# Patient Record
Sex: Female | Born: 1970 | Race: White | Hispanic: No | Smoking: Current every day smoker
Health system: Southern US, Community
[De-identification: ages and names within clinical notes are randomized; demographics above are authoritative.]

## PROBLEM LIST (undated history)

## (undated) DIAGNOSIS — F329 Major depressive disorder, single episode, unspecified: Secondary | ICD-10-CM

## (undated) DIAGNOSIS — F419 Anxiety disorder, unspecified: Secondary | ICD-10-CM

## (undated) DIAGNOSIS — F32A Depression, unspecified: Secondary | ICD-10-CM

## (undated) DIAGNOSIS — I82409 Acute embolism and thrombosis of unspecified deep veins of unspecified lower extremity: Secondary | ICD-10-CM

## (undated) DIAGNOSIS — I10 Essential (primary) hypertension: Secondary | ICD-10-CM

## (undated) HISTORY — PX: ABDOMINAL HYSTERECTOMY: SHX81

## (undated) HISTORY — PX: BLADDER SUSPENSION: SHX72

## (undated) HISTORY — PX: ENDOVEIN HARVEST OF GREATER SAPHENOUS VEIN: SHX5059

---

## 1997-09-03 ENCOUNTER — Ambulatory Visit (HOSPITAL_COMMUNITY): Admission: RE | Admit: 1997-09-03 | Discharge: 1997-09-03 | Payer: Self-pay | Admitting: Family Medicine

## 1998-05-29 ENCOUNTER — Ambulatory Visit (HOSPITAL_BASED_OUTPATIENT_CLINIC_OR_DEPARTMENT_OTHER): Admission: RE | Admit: 1998-05-29 | Discharge: 1998-05-29 | Payer: Self-pay

## 1999-07-30 ENCOUNTER — Ambulatory Visit (HOSPITAL_COMMUNITY): Admission: RE | Admit: 1999-07-30 | Discharge: 1999-07-30 | Payer: Self-pay

## 2002-08-28 ENCOUNTER — Other Ambulatory Visit: Admission: RE | Admit: 2002-08-28 | Discharge: 2002-08-28 | Payer: Self-pay | Admitting: Obstetrics and Gynecology

## 2007-05-17 ENCOUNTER — Other Ambulatory Visit: Admission: RE | Admit: 2007-05-17 | Discharge: 2007-05-17 | Payer: Self-pay | Admitting: Family Medicine

## 2007-05-25 ENCOUNTER — Encounter: Admission: RE | Admit: 2007-05-25 | Discharge: 2007-05-25 | Payer: Self-pay | Admitting: Family Medicine

## 2007-08-21 ENCOUNTER — Encounter (INDEPENDENT_AMBULATORY_CARE_PROVIDER_SITE_OTHER): Payer: Self-pay | Admitting: Obstetrics and Gynecology

## 2007-08-22 ENCOUNTER — Inpatient Hospital Stay (HOSPITAL_COMMUNITY): Admission: RE | Admit: 2007-08-22 | Discharge: 2007-08-23 | Payer: Self-pay | Admitting: Obstetrics and Gynecology

## 2008-10-28 ENCOUNTER — Encounter: Admission: RE | Admit: 2008-10-28 | Discharge: 2008-10-28 | Payer: Self-pay | Admitting: Nurse Practitioner

## 2009-04-07 ENCOUNTER — Emergency Department (HOSPITAL_BASED_OUTPATIENT_CLINIC_OR_DEPARTMENT_OTHER): Admission: EM | Admit: 2009-04-07 | Discharge: 2009-04-07 | Payer: Self-pay | Admitting: Emergency Medicine

## 2009-04-07 ENCOUNTER — Ambulatory Visit: Payer: Self-pay | Admitting: Diagnostic Radiology

## 2009-10-21 ENCOUNTER — Encounter: Admission: RE | Admit: 2009-10-21 | Discharge: 2009-10-21 | Payer: Self-pay | Admitting: Family Medicine

## 2010-06-08 NOTE — Op Note (Signed)
[name], [name]               ACCOUNT NO.:  [id number]   MEDICAL RECORD NO.:  [id number]          PATIENT TYPE:  AMB   LOCATION:  SDC                           FACILITY:  [facility]   PHYSICIAN:  Guy Sandifer. Henderson Cloud, M.D. DATE OF BIRTH:  Jan 08, 1971   DATE OF PROCEDURE:  08/21/2007  DATE OF DISCHARGE:                               OPERATIVE REPORT   PREOPERATIVE DIAGNOSES:  Menorrhagia, pelvic relaxation, and stress  urinary incontinence.   POSTOPERATIVE DIAGNOSES:  Menorrhagia, pelvic relaxation, and stress  urinary incontinence.   PROCEDURE:  Laparoscopically-assisted vaginal hysterectomy, anterior  vaginal repair, insertion of mesh, vaginal colpopexy, and posterior  vaginal repair.   SURGEON:  Guy Sandifer. Henderson Cloud, MD   ASSISTANT:  Freddy Finner, MD   ANESTHESIA:  General endotracheal intubation.   SPECIMEN:  Uterus to pathology.   ESTIMATED BLOOD LOSS:  300 mL.   COMPLICATIONS:  Rectal perforation.   INDICATIONS AND CONSENT:  This patient is a 40 year old married white  female G2 P2, status post tubal ligation who has heavy menses.  She also  has symptomatic pelvic relaxation and leaking of urine.  Details were  dictated in history and physical.  Laparoscopically-assisted vaginal  hysterectomy, removal of ovaries if abnormal, anterior vaginal repair,  posterior vaginal repair, insertion of mesh, and mid urethral sling have  been discussed preoperatively.  Potential risks and complications have  been discussed preoperatively including but not limited to infection,  organ damage, bleeding requiring transfusion of blood products with  possible transfusion reaction, HIV and hepatitis acquisition, DVT, PE,  pneumonia, fistula formation, postoperative dyspareunia, pelvic pain,  and laparotomy.  Issues of delayed healing, erosion, urinary retention,  self-catheterization, prolonged catheterization returned to the  operating room, dyspareunia, and recurrent pelvic relaxation, and  irritative voiding symptoms have been discussed preoperatively.  All  questions had been answered and consent was signed on the chart.   FINDINGS:  There are multiple filmy adhesions on the dome of the right  lobe of the liver to the diaphragm.  The left ovary contains a 2-cm  smooth translucent cyst.  Right ovary is normal.  Anterior posterior cul-  de-sacs were normal.  Uterus was retroverted about 6 to 8 weeks in size.   PROCEDURE:  The patient was taken to the operating room where she was  identified, placed in the dorsosupine position and general anesthesia  was induced via endotracheal intubation.  She was then placed in dorsal  lithotomy position where she was prepped abdominally and vaginally.  Bladder straight catheterized.  Hulka tenaculum was placed and the  uterus was manipulated.  She was draped in the sterile fashion.  The  infraumbilical and suprapubic areas are injected at the midline with  0.5% plain Marcaine.  A small infraumbilical incision is made and a  disposable Veress needle was placed on the first attempt without  difficulty.  A normal syringe and drop test were noted.  Two liters of  gas was insufflated under low pressure with good tympany in the right  upper quadrant.  Veress needle was removed and a10-11 Xcel bladeless  disposable trocar  sleeve was placed using direct visualization with the  diagnostic laparoscopic.  After placement, the operative laparoscope was  placed.  A small suprapubic incision was made and a 5-mm bladeless Xcel  disposable trocar sleeve was placed under direct visualization without  difficulty.  The above findings were noted.  The left ovarian cyst is  aspirated for straw-colored serous fluid.  However, the aspirator was  obstructed not allowing me to aspirate the fluid to be sent for  cytology.  Good hemostasis was noted.  Then, using the EnSeal bipolar  cautery cutting instrument, the proximal ligaments were taken down  bilaterally to  the vesicouterine peritoneum.  Good hemostasis was  maintained.  The vesicouterine peritoneum was then taken down cephalad  laterally sharply.  Suprapubic trocar sleeve was removed.  Instruments  were removed.  Attention was turned to the vagina.  Posterior cul-de-  sacs entered sharply and the cervix was circumscribed with unipolar  cautery.  Mucosa was advanced sharply and bluntly.  Then using the gyrus  bipolar cautery instrument, the uterosacral ligaments were taken down  bilaterally followed by the bladder pillars, cardinal ligaments, and the  uterine vessels.  The fundus was delivered posteriorly and the specimen  was delivered.  The uterosacral ligaments were plicated vaginal cuff  bilaterally with 0 Monocryl suture.  All suture will be 0 Monocryl  unless otherwise designated.  Uterosacral ligaments were then plicated  in the midline with a third suture and the cuff is closed with figure-of-  eights.  The anterior vaginal mucosa was then injected with 1% lidocaine  with 1:200,000 epinephrine.  A linear incision was made in the anterior  vaginal mucosa starting about 1 cm superior to the previous closure.  Dissection was carried out bilaterally sharply and bluntly until the  ischial spines could be identified bilaterally.  The sacrospinous  ligaments were also identified.  Then using the pedicle with  polypropylene mesh which had been trimmed at the posterior half, the  Capio was used to put the blue arms through the sacrospinous ligaments  bilaterally.  This was done at least one fingerbreadth medial to the  spines.  Then, about one fingerbreadths distal to the ischial spines  along the white line the white arms were placed again using the Capio  needle driver.  These are carefully drawn through the vaginal incision  lifting the polypropylene mesh into place.  The blue line is within the  midline.  The mesh is lying loose but flat.  The sheaths are removed  intact on all forearms  and excess mesh on the arms were then trimmed all  around.  The anterior vaginal mucosa was then closed in running fashion  with running locking 2-0 Monocryl suture.  Next, the spots of the  obturator foramen for the incisions and needle passage were marked  bilaterally and these areas were injected with the same solution.  The  suburethral anterior vaginal mucosa was also injected.  A small linear  incision was made under the urethra.  Sharp and blunt dissection was  then carried out bilaterally.  Foley catheter had already been placed  and the bladder had been drained.  The Foley was left in place.  Then  the halo needles were placed bilaterally through the obturator foramen  with the passage of the needle tip guided by the examining finger.  After passage of both needles, the Foley catheter was removed and  cystoscopy was carried out with a 70-degree cystoscope.  A 360-degree  inspection  reveals no foreign bodies, no evidence of perforation, and a  good puff of urine bilaterally from the ureters.  The cystoscope was  removed.  Foley catheter was replaced.  The bladder was drained and the  Foley was left in place.  The polypropylene mesh sling was then attached  to the needles bilaterally and withdrawn back through the incisions.  The sheath was removed from the sling intact.  The sling is laying flat,  is loose with a Kelly clamp easily placed below the sling with zero  tension on the sling.  It can be rotated 90 degrees to the floor without  any tension as well.  The vaginal mucosa was then reapproximated in a  running locking fashion with 2-0 Monocryl suture.  Excess sling is  trimmed at the level of the skin incisions bilaterally.  Next, a  posterior vaginal repair was initiated by removing a small diamond  shaped wedge of tissue from the posterior perineal body.  The posterior  vaginal mucosa was then injected with the same solution.  A midline  incision was made in the vaginal  mucosa dissecting with the Satinsky  scissors.  This dissection was then carried out bilaterally sharply and  then bluntly.  While bluntly dissecting a small hole was noted in the  rectum.  This was confirmed by rectal examination.  It was approximately  1 cm superior to the rectal sphincter and was no larger than the tip of  my finger approximately 1 to 1.5 cm.  The edges of the mucosa could be  clearly identified through the vaginal incision.  This area was  thoroughly irrigated.  Antibiotic irrigation solution was also ordered.  The mucosa was closed with a running layer of 2-0 chromic suture.  A  second imbricating layer was also placed with the same suture.  At this  point, antibiotic solution arrived and copious irrigation was again  carried out.  Inspection with rectal examination reveals excellent  closure.  The vaginal mucosa was then further dissected sharply and  bluntly.  After adequately freeing up the rectovaginal fascia, this was  also closed in a running fashion to cover the defect in a third layer  with zero Monocryl suture.  A posterior vaginal repair was then carried  out using 0 Monocryl sutures to again plicate the fascia.  Irrigation  was again carried out with antibiotic solution.  Small amount of vaginal  mucosa was trimmed and the posterior vaginal mucosa is closed in running  locking fashion with 2-0 Monocryl suture.  Two interrupted sutures of 0  Monocryl were also used to reapproximate the perineal body.  The vagina  was then packed with estrogen cream on the vaginal packing.  Attention  was returned to the abdomen.  After resuming pneumoperitoneum, the  suprapubic bladeless trocar sleeve is again replaced under direct  visualization.  Minor bleeding at the peritoneal edges was controlled  with bipolar cautery.  Copious irrigation was carried out and now  returns as clear.  Instruments were removed.  Pneumoperitoneum was  reduced.  Trocar sleeves were removed.   The suprapubic incision was  closed with a 2-0 Vicryl sutures subcutaneously.  Dermabond was used to  close both incisions.  All counts were correct.  The patient is  awakened, taken to recovery room in stable condition.      Guy Sandifer Henderson Cloud, M.D.  Electronically Signed     JET/MEDQ  D:  08/21/2007  T:  08/22/2007  Job:  [id number]

## 2010-06-08 NOTE — Discharge Summary (Signed)
[name], [name]               ACCOUNT NO.:  [id number]   MEDICAL RECORD NO.:  [id number]          PATIENT TYPE:  INP   LOCATION:  9307                          [facility]ACILITY:  [facility]   PHYSICIAN:  Guy Sandifer. Henderson Cloud, M.D. DATE OF BIRTH:  [birth year]   DATE OF ADMISSION:  08/21/2007  DATE OF DISCHARGE:  08/23/2007                               DISCHARGE SUMMARY   ADMITTING DIAGNOSES:  1. Menorrhagia.  2. Pelvic relaxation.  3. Stress urinary incontinence.   DISCHARGE DIAGNOSES:  1. Menorrhagia.  2. Pelvic relaxation.  3. Stress urinary incontinence.   PROCEDURE:  On August 21, 2007, laparoscopically assisted vaginal  hysterectomy, anterior vaginal repair, insertion of mesh, vaginal  colpopexy, posterior vaginal repair, and  transobturator midurethral  tape.   REASON FOR ADMISSION:  This patient is a 40 year old married white  female G2, P2 status post tubal ligation with complaints of heavy  bleeding and stress urinary incontinence.  Details are dictated in the  history and physical.  She is admitted for surgical management.   HOSPITAL COURSE:  The patient is admitted to the hospital and undergoes  the above procedure.  Estimated blood loss is 300 mL.  On the evening of  surgery, she has good pain control.  No nausea or vomiting.  Vital signs  are stable.  She is afebrile with good urine output.  On the first  postoperative day, the packing is removed and the Foley is removed.  She  is able to void and her residuals are between basically 50 and 200 mL.  She continues to void well.  Hemoglobin is 8.9 on the first  postoperative day.  Pathology is pending.  It should also be noted that  during her surgery, there was a rectal perforation.  This was repaired  at the time.  The patient was placed on Zosyn IV.  She did well.  She is  passing flatus.  No bowel movement yet.  She has remained afebrile.   CONDITION ON DISCHARGE:  Good.   DIET:  Regular as tolerated.   ACTIVITY:  No  lifting.  No operation of automobiles.  No vaginal entry.  No rectal entry.  Suppositories were enemas.   MEDICATIONS:  1. Percocet 5/325 mg #40 one to two p.o. q.6 h. p.r.n.  2. Ibuprofen 600 mg q.6 h. p.r.n.  3. Augmentin 875 mg #14 one p.o. b.i.d.   She is given care for instructions on stool softeners and fluids to  avoid constipation and no bearing down.   FOLLOWUP:  Follow up with [place] in office in 2 weeks.      Guy Sandifer Henderson Cloud, M.D.  Electronically Signed     JET/MEDQ  D:  08/23/2007  T:  08/24/2007  Job:  [id number]

## 2010-06-08 NOTE — H&P (Signed)
NAMECAROLLE, [name]               ACCOUNT NO.:  [id number]   MEDICAL RECORD NO.:  [id number]          PATIENT TYPE:  AMB   LOCATION:  SDC                           [facility]   PHYSICIAN:  Guy Sandifer. Henderson Cloud, M.D. DATE OF BIRTH:  September 08, 1970   DATE OF ADMISSION:  DATE OF DISCHARGE:                              HISTORY & PHYSICAL   CHIEF COMPLAINT:  Heavy menses, leaking urine, and cervical dysplasia.   HISTORY OF PRESENT ILLNESS:  This patient is a 40 year old married white  female G2, P2, status post tubal ligation who has 3 days of heavy  bleeding each month.  She was recently noted to have a low serum  ferritin with her primary care physician.  She has a sensation that  things are falling out of the vagina, which makes her quite  uncomfortable.  She also leaks urine with coughing and sneezing.  She  has to change underwear every day secondary to this.  Her recent Pap  smears were consistent with mild dysplasia.  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  After discussion of all the above and review  of the options, she is being admitted for laparoscopically-assisted  vaginal hysterectomy, anterior-posterior repair with grafts and  transobturator midurethral tape.  Potential risks and complications have  been reviewed preoperatively.   PAST MEDICAL HISTORY:  1. History of migraine headaches.  2. Abnormal Pap smears.   PAST SURGICAL HISTORY:  Tubal ligation in 1990 and also includes  multiple vein stripping on her legs.   OBSTETRIC HISTORY:  Vaginal delivery x2.   MEDICATIONS:  Ibuprofen p.r.n. and iron once a day.   ALLERGIES:  CODEINE.   SOCIAL HISTORY:  Denies tobacco, alcohol, or drug abuse.   REVIEW OF SYSTEMS:  Neuro:  Headache as above.  Cardiac:  Denied chest  pain.  Pulmonary:  Denies shortness of breath.  GI:  Denies recent  changes in bowel habits.   FAMILY HISTORY:  Positive for headaches, heart disease, irritable bowel  syndrome, arthritis,  diabetes, and cancer including uterine cancer in  her mother.   PHYSICAL EXAMINATION:  VITAL SIGNS:  Height 5 feet 5 inches, weight 150  pounds, and blood pressure 120/86.  HEENT:  Without thyromegaly.  LUNGS:  Clear to auscultation.  HEART:  Regular rate and rhythm.  BACK:  Without CVA tenderness.  ABDOMEN:  Soft and nontender without masses.  PELVIC EXAM:  Vagina and cervix without lesion.  Uterus is upper normal  size with first- to second-degree prolapse.  There is a first- to second-  degree cystocele.  Adnexa nontender without masses.  RECTAL EXAM:  Reveals adequate rectal sphincter tone and a lax  rectovaginal septum.  EXTREMITIES:  Grossly within normal limits.  NEUROLOGICAL EXAM:  Grossly within normal limits.   ASSESSMENT:  Menorrhagia, pelvic relaxation, and stress urinary  incontinence.   PLAN:  Laparoscopically-assisted vaginal hysterectomy, anterior-  posterior repair with grafts, and midurethral transobturator sling.       Guy Sandifer Henderson Cloud, M.D.  Electronically Signed     JET/MEDQ  D:  08/16/2007  T:  08/17/2007  Job:  [id number]

## 2010-06-11 NOTE — Op Note (Signed)
[facility]  Patient:    [name], [name]                        MRN: [id number] Proc. Date: 07/30/99 Attending:  Zigmund Daniel, M.D. CC:         Evette Georges, M.D. 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  OPERATION:  Ligation and stripping of varicose veins with ligation and complete stripping of the left long saphenous vein.  SURGEON:  Zigmund Daniel, M.D.  ANESTHESIA:  General.  DESCRIPTION OF PROCEDURE:  Patient was monitored and anesthetized and after routine preparation and draping of the left lower abdomen, groin, thigh leg and foot, I made an incision at the ankle at the site of her previous phlebectomy incision and searched for the long saphenous vein.  I found several varicose veins which I dissected up and ligated.  I could not locate the long saphenous vein because of extensive scar tissue and the fact that it possibly had already been excised at that point.  I made an incision in the distal medial thigh where I could palpate the enlargement of the saphenous and went down on it and controlled it proximally and distally with 2-0 silk sutures. I then made a hole in it and passed the stripper towards the foot and it went right down to the ankle at the usual site of the long saphenous vein. I made an incision on it and brought it out at that point and stripped the vein between the thigh and the ankle.  I got hemostasis with pressure.  I then passed the stripper upward and went proximally up to the groin.  I cut down over the usual site of the long saphenous vein and found it in its usual position.  It was quite enlarged.  There were several side branches which I ligated and divided.   I ligated the long saphenous vein near the saphenofemoral junction with 2-0 silk.  I then stripped it out to the thigh and got hemostasis with pressure.  After achieving good hemostasis, I closed all incisions with intracuticular 4-0 Vicryl and Steri-Strips and applied a bulky compression bandage.  The patient tolerated the procedure well.DD: 07/30/99 TD:  07/30/99 Job: [id number]

## 2010-06-17 ENCOUNTER — Inpatient Hospital Stay (HOSPITAL_COMMUNITY)
Admission: RE | Admit: 2010-06-17 | Discharge: 2010-06-18 | DRG: 882 | Disposition: A | Discharge status: Home / Self Care | Payer: 59 | Attending: Psychiatry | Admitting: Psychiatry

## 2010-06-17 DIAGNOSIS — D509 Iron deficiency anemia, unspecified: Secondary | ICD-10-CM

## 2010-06-17 DIAGNOSIS — F17200 Nicotine dependence, unspecified, uncomplicated: Secondary | ICD-10-CM

## 2010-06-17 DIAGNOSIS — Z86718 Personal history of other venous thrombosis and embolism: Secondary | ICD-10-CM

## 2010-06-17 DIAGNOSIS — F4323 Adjustment disorder with mixed anxiety and depressed mood: Principal | ICD-10-CM

## 2010-06-18 DIAGNOSIS — Z630 Problems in relationship with spouse or partner: Secondary | ICD-10-CM

## 2010-06-18 DIAGNOSIS — F4323 Adjustment disorder with mixed anxiety and depressed mood: Secondary | ICD-10-CM

## 2010-06-18 DIAGNOSIS — Z7189 Other specified counseling: Secondary | ICD-10-CM

## 2010-06-18 LAB — CBC
HCT: 42.9 % (ref 36.0–46.0)
Hemoglobin: 13.9 g/dL (ref 12.0–15.0)
MCH: 29.4 pg (ref 26.0–34.0)
RBC: 4.72 MIL/uL (ref 3.87–5.11)

## 2010-06-18 LAB — COMPREHENSIVE METABOLIC PANEL
ALT: 22 U/L (ref 0–35)
AST: 21 U/L (ref 0–37)
CO2: 30 mEq/L (ref 19–32)
Chloride: 100 mEq/L (ref 96–112)
GFR calc Af Amer: >60 mL/min (ref >60)
GFR calc non Af Amer: >60 mL/min (ref >60)
Glucose, Bld: 88 mg/dL (ref 70–99)
Sodium: 137 mEq/L (ref 135–145)
Total Bilirubin: 0.2 mg/dL — ABNORMAL LOW (ref 0.3–1.2)

## 2010-06-20 NOTE — H&P (Signed)
[name], [name]               ACCOUNT NO.:  [id number]  MEDICAL RECORD NO.:  [id number]           PATIENT TYPE:  I  LOCATION:  0507                          FACILITY:  [facility]  PHYSICIAN:  Franchot Gallo, MD     DATE OF BIRTH:  [birth year]  DATE OF ADMISSION:  06/17/2010 DATE OF DISCHARGE:  06/18/2010                      PSYCHIATRIC ADMISSION ASSESSMENT   CHIEF COMPLAINT:  "I found out my husband was having an affair."  HISTORY OF PRESENT ILLNESS:  [name] is a 40 year old married white female who was evaluated and admitted to Behavioral [facility] for depression and anxiety related to issues concerning her marriage. The patient states that she and her husband have been together for approximately 13 years, but have been married for 1 year.    She states describes their marriage as being very strong and they are "soul mates."  However in February 2012, she began to suspect that he maybe having an affair with his Diplomatic Services operational officer at work.  This was later confirmed. The patient states that the affair has now ended, but she has had difficulty accepting it.  The patient states that she is having some difficulty initiating and maintaining sleep, but reports that with the Vistaril given at  time of admission, she slept through the night.  She reports mild to moderate symptoms of anxiety as well as moderate feelings of sadness, anhedonia and depressed mood.  She denies any suicidal or homicidal ideations nor does she report any past suicide attempts or gestures.  The patient denies any prolonged manic or hypomanic symptoms nor does she report any past or current auditory or visual hallucinations or delusional thinking.  The patient reports to drinking an occasional alcoholic beverage, but denies any abuse of alcohol.  She denies any use of illicit drugs, but does report smoking approximately one-half pack of cigarettes per day. The patient states today that she preferred to leave and would  like to be discharged as soon as possible.  PAST PSYCHIATRIC HISTORY:  The patient denies any past psychiatric hospitalizations.  CURRENT MEDICATIONS: 1. Zoloft 50 mg p.o. q.a.m. (started 2 days ago). 2. Lunesta p.r.n. for sleep. 3. Xanax 0.5 mg p.o. b.i.d. for anxiety.  ALLERGIES:  CODEINE results in nausea and vomiting.  MEDICAL ILLNESSES: 1. Iron-deficiency anemia. 2. History of DVTs.  PAST SURGICAL HISTORY: 1. Hysterectomy in 2008. 2. 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  FAMILY HISTORY:  The patient states that her mother has a history of depression.  She denies any family history of other psychiatric or substance abuse related illnesses.  SOCIAL HISTORY:  The patient reports to being born in [place], [place] and has lived in [place], [place] for approximately 19 years.  She reports being married on 3 occasions with the first marriage ending secondary to infidelity by her husband.  She states that her second marriage ended secondary to drug and alcohol abuse in her husband.  She has 2 children, a son 70 years of age and a daughter 97 years of age, both in good health.  The patient reports that she completed high  school and college, and currently works for the [employer] in the [employer].  As stated in the HPI, she reports occasional use of tobacco products, but denies any use of illicit drugs. She does report occasional use of alcohol.  MENTAL STATUS EXAM:  GENERAL:  The patient was alert and oriented x3. She was moderately guarded, but cooperative throughout the evaluation. Speech was appropriate in terms of rate and volume.  Mood appeared mild to moderately depressed.  Affect was mild to moderately anxious. Thoughts, the patient denied any obvious delusions or hallucinations nor did she report any suicidal or homicidal ideations.  Judgment and insight today both appeared fair.  IMPRESSION:   AXIS I:  Adjustment disorder with mixed anxiety and depressed mood - currently under fair control.  Partner relational problem. AXIS II:  Deferred. AXIS III:  Please see medical history above. AXIS IV:  Recent infidelity by husband.  Marital discord.  Work-related stresses. AXIS V:  Global assessment of functioning at time of admission approximately 55.  Highest global assessment of functioning in the past year approximately 75.  PLAN: 1. The patient was continued on the medication Zoloft at 50 mg p.o.     q.a.m. 2. The patient was continued on the medication hydroxyzine at 25 mg     p.o. nightly for sleep. 3. It was recommended that the patient discontinue further use of the     Lunesta and the Xanax. 4. Since the patient insisted on discharge and since the patient is     not in obvious danger to self or others, and since the patient's     family also agreed that she would be safe for discharge, the     patient was discharged per her request. 5. She will follow up with her outpatient care as scheduled.    __________________________________ Franchot Gallo, MD     RR/MEDQ  D:  06/18/2010  T:  06/19/2010  Job:  [id number]  Electronically Signed by Franchot Gallo MD on 06/20/2010 02:14:25 PM

## 2010-09-14 ENCOUNTER — Other Ambulatory Visit: Payer: Self-pay | Admitting: Family Medicine

## 2010-09-14 DIAGNOSIS — Z1231 Encounter for screening mammogram for malignant neoplasm of breast: Secondary | ICD-10-CM

## 2010-10-22 LAB — CBC
Hemoglobin: 8.9 — ABNORMAL LOW
MCHC: 32.8
MCHC: 32.9
Platelets: 350
RDW: 15.4
RDW: 15.8 — ABNORMAL HIGH

## 2010-10-22 LAB — URINALYSIS, ROUTINE W REFLEX MICROSCOPIC
Leukocytes, UA: NEGATIVE
Nitrite: NEGATIVE
Protein, ur: NEGATIVE
Urobilinogen, UA: 0.2

## 2010-10-22 LAB — URINE MICROSCOPIC-ADD ON

## 2010-10-25 ENCOUNTER — Ambulatory Visit: Payer: 59

## 2010-12-19 DIAGNOSIS — F3289 Other specified depressive episodes: Secondary | ICD-10-CM | POA: Insufficient documentation

## 2010-12-19 DIAGNOSIS — F329 Major depressive disorder, single episode, unspecified: Secondary | ICD-10-CM | POA: Insufficient documentation

## 2010-12-20 ENCOUNTER — Encounter: Payer: Self-pay | Admitting: *Deleted

## 2010-12-20 ENCOUNTER — Emergency Department (HOSPITAL_COMMUNITY)
Admission: EM | Admit: 2010-12-20 | Discharge: 2010-12-20 | Disposition: A | Discharge status: Home / Self Care | Payer: 59 | Attending: Emergency Medicine | Admitting: Emergency Medicine

## 2010-12-20 DIAGNOSIS — F32A Depression, unspecified: Secondary | ICD-10-CM

## 2010-12-20 DIAGNOSIS — F329 Major depressive disorder, single episode, unspecified: Secondary | ICD-10-CM

## 2010-12-20 LAB — COMPREHENSIVE METABOLIC PANEL
ALT: 28 U/L (ref 0–35)
AST: 27 U/L (ref 0–37)
CO2: 29 mEq/L (ref 19–32)
Calcium: 9.3 mg/dL (ref 8.4–10.5)
GFR calc non Af Amer: >90 mL/min (ref >90)
Sodium: 139 mEq/L (ref 135–145)
Total Protein: 7.4 g/dL (ref 6.0–8.3)

## 2010-12-20 LAB — CBC
MCH: 29.4 pg (ref 26.0–34.0)
Platelets: 260 10*3/uL (ref 150–400)
RBC: 4.76 MIL/uL (ref 3.87–5.11)
WBC: 7.5 10*3/uL (ref 4.0–10.5)

## 2010-12-20 LAB — RAPID URINE DRUG SCREEN, HOSP PERFORMED
Amphetamines: NOT DETECTED
Barbiturates: NOT DETECTED
Benzodiazepines: POSITIVE — AB
Cocaine: NOT DETECTED
Tetrahydrocannabinol: NOT DETECTED

## 2010-12-20 NOTE — ED Provider Notes (Signed)
History     CSN: [id number] Arrival date & time: 12/20/2010 12:21 AM   First MD Initiated Contact with Patient 12/20/10 0103      Chief Complaint  Patient presents with  . Medical Clearance    from [facility]    (Consider location/radiation/quality/duration/timing/severity/associated sxs/prior treatment) The history is provided by the patient, a relative and the police.   the patient is a 40 year old, female, who is brought into the emergency department for evaluation.  For depression.  Involuntary commitment orders were signed by her daughter.  She was taken to [facility] and then sent here for medical clearance.  The patient is depressed because her husband of 14 years.  Has had an affair.  Now.  He is being seated for depravation of infection.  By the husband of the lady.  He was having affair with.  The patient's husband will not talk to his wife.  He has gone to his parents house to clear his mind.  Therefore, she is very depressed.  Last week.  She was started on Zoloft.  Her daughter and brother brought her here for an evaluation.  The patient denies suicidal or homicidal ideations.  She has a normal mental status.  She is not psychotic and she has no evidence of intoxication.   No past medical history on file.  Past Surgical History  Procedure Date  . Abdominal hysterectomy     No family history on file.  History  Substance Use Topics  . Smoking status: Not on file  . Smokeless tobacco: Not on file  . Alcohol Use:     OB History    Grav Para Term Preterm Abortions TAB SAB Ect Mult Living                  Review of Systems  Constitutional: Negative for fever.  HENT: Negative for congestion.   Eyes: Negative for redness.  Respiratory: Negative for cough.   Gastrointestinal: Negative for abdominal pain.  Musculoskeletal: Negative for back pain.  Skin: Negative for rash.  Neurological: Negative for headaches.  Psychiatric/Behavioral: Negative for suicidal ideas,  hallucinations, confusion and self-injury.    Allergies  Codeine  Home Medications   Current Outpatient Rx  Name Route Sig Dispense Refill  . ALPRAZOLAM 1 MG PO TABS Oral Take 1 mg by mouth at bedtime as needed.      . SERTRALINE HCL 100 MG PO TABS Oral Take 100 mg by mouth daily.      . TRAZODONE HCL 100 MG PO TABS Oral Take 100 mg by mouth at bedtime.        BP 130/78  Pulse 98  Temp(Src) 98.5 F (36.9 C) (Oral)  Resp 19  SpO2 100%  Physical Exam  Constitutional: She is oriented to person, place, and time. She appears well-developed and well-nourished.  HENT:  Head: Normocephalic and atraumatic.  Eyes: Pupils are equal, round, and reactive to light.  Neck: Normal range of motion.  Cardiovascular: Normal rate, regular rhythm and normal heart sounds.   No murmur heard. Pulmonary/Chest: Effort normal and breath sounds normal. No respiratory distress. She has no wheezes. She has no rales.  Abdominal: Soft. She exhibits no distension and no mass. There is no tenderness. There is no rebound and no guarding.  Musculoskeletal: Normal range of motion. She exhibits no edema and no tenderness.  Neurological: She is alert and oriented to person, place, and time. No cranial nerve deficit.  Skin: Skin is warm and dry. No rash  noted. No erythema.  Psychiatric: Her behavior is normal. Judgment and thought content normal.       Depressed no suicidal or homicidal thought    ED Course  Procedures (including critical care time)  Depression because of recent travel by her husband.  No suicidal or homicidal.  He A. since.  No psychotic features.  Alcohol is present.  However, no evidence of severe impairment.  Labs Reviewed  COMPREHENSIVE METABOLIC PANEL - Abnormal; Notable for the following:    Total Bilirubin 0.2 (*)    All other components within normal limits  ETHANOL - Abnormal; Notable for the following:    Alcohol, Ethyl (B) 125 (*)    All other components within normal limits    URINE RAPID DRUG SCREEN (HOSP PERFORMED) - Abnormal; Notable for the following:    Benzodiazepines POSITIVE (*)    All other components within normal limits  CBC   No results found.   No diagnosis found.    MDM  Depression without suicidal or homicidal thoughts.        Nicholes Stairs, MD 12/20/10 [phone]

## 2010-12-20 NOTE — ED Notes (Signed)
Pt reports to [facility] for medical clearance from [facility].  Pt states she went to [facility] due to her brother having her involuntarily committed due to her statement to him that she didn't feel like she could live anymore.  Pt is stating that she has recently found out that her husband of 13 years has been cheating with his [employer] operational officer.  Pt now denying SI/HI.  Pt stating she is "just absolutely hurt".
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
## 2011-05-04 ENCOUNTER — Encounter (HOSPITAL_BASED_OUTPATIENT_CLINIC_OR_DEPARTMENT_OTHER): Payer: Self-pay | Admitting: *Deleted

## 2011-05-04 ENCOUNTER — Emergency Department (HOSPITAL_BASED_OUTPATIENT_CLINIC_OR_DEPARTMENT_OTHER)
Admission: EM | Admit: 2011-05-04 | Discharge: 2011-05-05 | Disposition: A | Discharge status: Home / Self Care | Payer: 59 | Attending: Emergency Medicine | Admitting: Emergency Medicine

## 2011-05-04 DIAGNOSIS — F17200 Nicotine dependence, unspecified, uncomplicated: Secondary | ICD-10-CM | POA: Insufficient documentation

## 2011-05-04 DIAGNOSIS — F419 Anxiety disorder, unspecified: Secondary | ICD-10-CM

## 2011-05-04 DIAGNOSIS — F3289 Other specified depressive episodes: Secondary | ICD-10-CM | POA: Insufficient documentation

## 2011-05-04 DIAGNOSIS — R079 Chest pain, unspecified: Secondary | ICD-10-CM | POA: Insufficient documentation

## 2011-05-04 DIAGNOSIS — F329 Major depressive disorder, single episode, unspecified: Secondary | ICD-10-CM | POA: Insufficient documentation

## 2011-05-04 DIAGNOSIS — F411 Generalized anxiety disorder: Secondary | ICD-10-CM | POA: Insufficient documentation

## 2011-05-04 HISTORY — DX: Anxiety disorder, unspecified: F41.9

## 2011-05-04 HISTORY — DX: Depression, unspecified: F32.A

## 2011-05-04 HISTORY — DX: Major depressive disorder, single episode, unspecified: F32.9

## 2011-05-04 HISTORY — DX: Acute embolism and thrombosis of unspecified deep veins of unspecified lower extremity: I82.409

## 2011-05-04 NOTE — ED Notes (Signed)
Pt c/o chest pain this am after confrontation with mother law.
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
## 2011-05-05 ENCOUNTER — Encounter (HOSPITAL_BASED_OUTPATIENT_CLINIC_OR_DEPARTMENT_OTHER): Payer: Self-pay | Admitting: Emergency Medicine

## 2011-05-05 LAB — BASIC METABOLIC PANEL
Calcium: 9 mg/dL (ref 8.4–10.5)
Creatinine, Ser: 0.6 mg/dL (ref 0.50–1.10)
GFR calc non Af Amer: >90 mL/min (ref >90)
Sodium: 137 mEq/L (ref 135–145)

## 2011-05-05 LAB — CBC
MCH: 31.8 pg (ref 26.0–34.0)
MCHC: 34.2 g/dL (ref 30.0–36.0)
MCV: 92.9 fL (ref 78.0–100.0)
Platelets: 208 10*3/uL (ref 150–400)
RDW: 13.2 % (ref 11.5–15.5)

## 2011-05-05 LAB — DIFFERENTIAL
Basophils Absolute: 0.1 10*3/uL (ref 0.0–0.1)
Eosinophils Absolute: 0.2 10*3/uL (ref 0.0–0.7)
Eosinophils Relative: 2 % (ref 0–5)

## 2011-05-05 MED ORDER — LORAZEPAM 1 MG PO TABS: 1.0000 mg | ORAL_TABLET | Freq: Three times a day (TID) | ORAL | Status: DC | PRN

## 2011-05-05 MED ORDER — LORAZEPAM 2 MG/ML IJ SOLN
1.0000 mg | Freq: Once | INTRAMUSCULAR | Status: AC
  Administered 2011-05-05: 1 mg via INTRAVENOUS
  Filled 2011-05-05: qty 1

## 2011-05-05 MED ORDER — SODIUM CHLORIDE 0.9 % IV SOLN
INTRAVENOUS | Status: DC
  Administered 2011-05-05: 01:00:00 via INTRAVENOUS

## 2011-05-05 MED ORDER — ASPIRIN 81 MG PO CHEW
324.0000 mg | CHEWABLE_TABLET | Freq: Once | ORAL | Status: AC
  Administered 2011-05-05: 324 mg via ORAL
  Filled 2011-05-05: qty 4

## 2011-05-05 NOTE — ED Provider Notes (Signed)
History     CSN: [id number]  Arrival date & time 05/04/11  2225   First MD Initiated Contact with Patient 05/05/11 0018      Chief Complaint  Patient presents with  . Chest Pain    (Consider location/radiation/quality/duration/timing/severity/associated sxs/prior treatment) HPI This is a 41 year old white female with a history of chest pain suggestive morning about 11 PM. This occurred after an emotional argument with her mother-in-law. She has felt anxious and short of breath since then. The chest pain is felt in the left parasternal region. There is a persistent mild pressure-like character with 15 second episodes of a sharp or, more severe pain. There does not seem to be any exacerbating or mitigating factors apart from the argument earlier. She denies nausea, vomiting or diarrhea. She denies pain or swelling in her legs. 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 She states her principal reason for being here is for something to help her sleep.  Past Medical History  Diagnosis Date  . Depressed   . Anxiety   . Deep vein thrombosis     Past Surgical History  Procedure Date  . Abdominal hysterectomy   . Endovein harvest of greater saphenous vein     History reviewed. No pertinent family history.  History  Substance Use Topics  . Smoking status: Current Everyday Smoker -- 0.5 packs/day  . Smokeless tobacco: Not on file  . Alcohol Use: No    OB History    Grav Para Term Preterm Abortions TAB SAB Ect Mult Living                  Review of Systems  All other systems reviewed and are negative.    Allergies  Codeine  Home Medications   Current Outpatient Rx  Name Route Sig Dispense Refill  . ALPRAZOLAM 1 MG PO TABS Oral Take 1 mg by mouth at bedtime as needed.      . SERTRALINE HCL 100 MG PO TABS Oral Take 100 mg by mouth daily.      . TRAZODONE HCL 100 MG PO TABS Oral  Take 100 mg by mouth at bedtime.        BP 179/115  Pulse 103  Temp(Src) 98.3 F (36.8 C) (Oral)  Resp 16  Ht 5\' 5"  (1.651 m)  Wt 128 lb (58.06 kg)  BMI 21.30 kg/m2  SpO2 100%  Physical Exam General: Well-developed, well-nourished female in no acute distress; appearance consistent with age of record HENT: normocephalic, atraumatic Eyes: pupils equal round and reactive to light; extraocular muscles intact Neck: supple Heart: regular rate and rhythm; no murmurs, rubs or gallops Lungs: clear to auscultation bilaterally Chest: Localized left parasternal tenderness Abdomen: soft; nondistended; nontender Extremities: No deformity; full range of motion; pulses normal; no calf tenderness or edema Neurologic: Awake, alert and oriented; motor function intact in all extremities and symmetric; no facial droop Skin: Warm and dry Psychiatric: Anxious; depressed    ED Course  Procedures (including critical care time)     MDM  EKG Interpretation:  Date & Time: 05/05/2011 10:41 PM  Rate: 110  Rhythm: sinus tachycardia  QRS Axis: normal  Intervals: normal  ST/T Wave abnormalities: normal  Conduction Disutrbances:none  Narrative Interpretation:   Old EKG Reviewed: none available  Nursing notes and vitals signs, including pulse oximetry, reviewed.  Summary of this visit's results, reviewed by myself:  Labs:  Results for  orders placed during the hospital encounter of 05/04/11  D-DIMER, QUANTITATIVE      Component Value Range   D-Dimer, Quant <0.22  0.00 - 0.48 (ug/mL-FEU)  TROPONIN I      Component Value Range   Troponin I <0.30  <0.30 (ng/mL)  CBC      Component Value Range   WBC 10.9 (*) 4.0 - 10.5 (K/uL)   RBC 4.09  3.87 - 5.11 (MIL/uL)   Hemoglobin 13.0  12.0 - 15.0 (g/dL)   HCT 45.4  09.8 - 11.9 (%)   MCV 92.9  78.0 - 100.0 (fL)   MCH 31.8  26.0 - 34.0 (pg)   MCHC 34.2  30.0 - 36.0 (g/dL)   RDW 14.7  82.9 - 56.2 (%)   Platelets 208  150 - 400 (K/uL)  DIFFERENTIAL       Component Value Range   Neutrophils Relative 56  43 - 77 (%)   Neutro Abs 6.1  1.7 - 7.7 (K/uL)   Lymphocytes Relative 31  12 - 46 (%)   Lymphs Abs 3.3  0.7 - 4.0 (K/uL)   Monocytes Relative 11  3 - 12 (%)   Monocytes Absolute 1.2 (*) 0.1 - 1.0 (K/uL)   Eosinophils Relative 2  0 - 5 (%)   Eosinophils Absolute 0.2  0.0 - 0.7 (K/uL)   Basophils Relative 1  0 - 1 (%)   Basophils Absolute 0.1  0.0 - 0.1 (K/uL)  BASIC METABOLIC PANEL      Component Value Range   Sodium 137  135 - 145 (mEq/L)   Potassium 3.4 (*) 3.5 - 5.1 (mEq/L)   Chloride 100  96 - 112 (mEq/L)   CO2 27  19 - 32 (mEq/L)   Glucose, Bld 91  70 - 99 (mg/dL)   BUN 14  6 - 23 (mg/dL)   Creatinine, Ser 1.30  0.50 - 1.10 (mg/dL)   Calcium 9.0  8.4 - 86.5 (mg/dL)   GFR calc non Af Amer >90  >90 (mL/min)   GFR calc Af Amer >90  >90 (mL/min)   1:19 AM Feels better, chest pain relieved after 1 mg of IV Ativan. She will followup with her primary care physician for further treatment. She requests an anxiolytic in the meantime.              Hanley Seamen, MD 05/05/11 [phone]

## 2011-05-05 NOTE — Discharge Instructions (Signed)

## 2011-05-06 NOTE — ED Notes (Signed)
Pt called and requested that we fax a work note to her employer. Pt told she could stop by and pick up a work note with ID.

## 2011-05-08 ENCOUNTER — Encounter (HOSPITAL_COMMUNITY): Payer: Self-pay | Admitting: *Deleted

## 2011-05-08 ENCOUNTER — Emergency Department (HOSPITAL_COMMUNITY)
Admission: EM | Admit: 2011-05-08 | Discharge: 2011-05-09 | Disposition: A | Discharge status: Home / Self Care | Payer: 59 | Attending: Emergency Medicine | Admitting: Emergency Medicine

## 2011-05-08 ENCOUNTER — Other Ambulatory Visit: Payer: Self-pay

## 2011-05-08 DIAGNOSIS — F3289 Other specified depressive episodes: Secondary | ICD-10-CM | POA: Insufficient documentation

## 2011-05-08 DIAGNOSIS — T43294A Poisoning by other antidepressants, undetermined, initial encounter: Secondary | ICD-10-CM | POA: Insufficient documentation

## 2011-05-08 DIAGNOSIS — F410 Panic disorder [episodic paroxysmal anxiety] without agoraphobia: Secondary | ICD-10-CM

## 2011-05-08 DIAGNOSIS — T43201A Poisoning by unspecified antidepressants, accidental (unintentional), initial encounter: Secondary | ICD-10-CM | POA: Insufficient documentation

## 2011-05-08 DIAGNOSIS — F32A Depression, unspecified: Secondary | ICD-10-CM

## 2011-05-08 DIAGNOSIS — F329 Major depressive disorder, single episode, unspecified: Secondary | ICD-10-CM | POA: Insufficient documentation

## 2011-05-08 LAB — ETHANOL: Alcohol, Ethyl (B): 46 mg/dL — ABNORMAL HIGH (ref 0–11)

## 2011-05-08 LAB — POCT I-STAT, CHEM 8
BUN: 8 mg/dL (ref 6–23)
Chloride: 103 mEq/L (ref 96–112)
Potassium: 3.1 mEq/L — ABNORMAL LOW (ref 3.5–5.1)
Sodium: 140 mEq/L (ref 135–145)

## 2011-05-08 LAB — URINE MICROSCOPIC-ADD ON

## 2011-05-08 LAB — BASIC METABOLIC PANEL
BUN: 9 mg/dL (ref 6–23)
Calcium: 9.3 mg/dL (ref 8.4–10.5)
Creatinine, Ser: 0.58 mg/dL (ref 0.50–1.10)
GFR calc non Af Amer: >90 mL/min (ref >90)
Glucose, Bld: 106 mg/dL — ABNORMAL HIGH (ref 70–99)
Sodium: 136 mEq/L (ref 135–145)

## 2011-05-08 LAB — CBC
MCH: 32 pg (ref 26.0–34.0)
MCV: 93 fL (ref 78.0–100.0)
Platelets: 261 10*3/uL (ref 150–400)
RBC: 4.69 MIL/uL (ref 3.87–5.11)

## 2011-05-08 LAB — URINALYSIS, ROUTINE W REFLEX MICROSCOPIC
Bilirubin Urine: NEGATIVE
Specific Gravity, Urine: 1.013 (ref 1.005–1.030)
pH: 6.5 (ref 5.0–8.0)

## 2011-05-08 LAB — GLUCOSE, CAPILLARY

## 2011-05-08 LAB — DIFFERENTIAL
Eosinophils Absolute: 0.1 10*3/uL (ref 0.0–0.7)
Eosinophils Relative: 1 % (ref 0–5)
Lymphs Abs: 2 10*3/uL (ref 0.7–4.0)

## 2011-05-08 LAB — ACETAMINOPHEN LEVEL: Acetaminophen (Tylenol), Serum: <15 ug/mL (ref 10–30)

## 2011-05-08 MED ORDER — POTASSIUM CHLORIDE 20 MEQ/15ML (10%) PO LIQD
ORAL | Status: AC
  Filled 2011-05-08: qty 30

## 2011-05-08 MED ORDER — ACETAMINOPHEN 325 MG PO TABS
650.0000 mg | ORAL_TABLET | Freq: Once | ORAL | Status: AC
  Administered 2011-05-08: 650 mg via ORAL
  Filled 2011-05-08: qty 2

## 2011-05-08 MED ORDER — POTASSIUM CHLORIDE 20 MEQ/15ML (10%) PO LIQD
40.0000 meq | Freq: Once | ORAL | Status: AC
  Administered 2011-05-08: 40 meq via ORAL
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
## 2011-05-08 NOTE — ED Notes (Signed)
ZOX:WRUE<AV> Expected date:<BR> Expected time: 4:33 PM<BR> Means of arrival:Ambulance<BR> Comments:<BR> M10 -- Overdose

## 2011-05-08 NOTE — ED Provider Notes (Signed)
History     CSN: [id number]  Arrival date & time 05/08/11  1656   First MD Initiated Contact with Patient 05/08/11 1736      Chief Complaint  Patient presents with  . Drug Overdose    (Consider location/radiation/quality/duration/timing/severity/associated sxs/prior treatment) HPI Comments: [name] is a 41 y.o. Female who was at home. Today, frustrated by the situation with her husband. When she took 3 Effexor XR 75 mg tablets to help relax. She denies suicidal ideation. She did not try to kill herself today. She has no suicidal plan. She sees her psychiatrist regularly who prescribes the Effexor. She does not see a counselor currently. She has had a previous suicide attempt and been admitted to the behavioral health hospital. She is separated from her husband, who is apparently seeking a divorce. The patient's family members, had been trying to get her to, end the relationship. The patient is frustrated by that. Currently, in the emergency department. She denies headache, fever, chills, nausea, vomiting, back pain, abdominal pain, dizziness, paresthesias, or weakness.   Patient is a 41 y.o. female presenting with Overdose. The history is provided by the patient.  Drug Overdose    Past Medical History  Diagnosis Date  . Depressed   . Anxiety   . Deep vein thrombosis     Past Surgical History  Procedure Date  . Abdominal hysterectomy   . Endovein harvest of greater saphenous vein     History reviewed. No pertinent family history.  History  Substance Use Topics  . Smoking status: Current Everyday Smoker -- 0.5 packs/day  . Smokeless tobacco: Not on file  . Alcohol Use: No    OB History    Grav Para Term Preterm Abortions TAB SAB Ect Mult Living                  Review of Systems  All other systems reviewed and are negative.    Allergies  Codeine  Home Medications   Current Outpatient Rx  Name Route Sig Dispense Refill  . ALPRAZOLAM 1 MG PO TABS Oral  Take 1 mg by mouth 3 (three) times daily as needed. Anxiety.    Marland Kitchen HYDROCODONE-ACETAMINOPHEN 5-325 MG PO TABS Oral Take 1 tablet by mouth See admin instructions. Every 6 to 8 hours as needed for pain.    . VENLAFAXINE HCL ER 75 MG PO CP24 Oral Take 75 mg by mouth daily.      BP 179/104  Pulse 90  Temp(Src) 97.7 F (36.5 C) (Oral)  Resp 20  SpO2 100%  Physical Exam  Nursing note and vitals reviewed. Constitutional: She is oriented to person, place, and time. She appears well-developed and well-nourished.  HENT:  Head: Normocephalic and atraumatic.  Eyes: Conjunctivae and EOM are normal. Pupils are equal, round, and reactive to light.  Neck: Normal range of motion and phonation normal. Neck supple.  Cardiovascular: Normal rate, regular rhythm and intact distal pulses.   Pulmonary/Chest: Effort normal and breath sounds normal. She exhibits no tenderness.  Abdominal: Soft. She exhibits no distension. There is no tenderness. There is no guarding.  Musculoskeletal: Normal range of motion.  Neurological: She is alert and oriented to person, place, and time. She has normal strength. She exhibits normal muscle tone.  Skin: Skin is warm and dry.  Psychiatric: Her behavior is normal. Judgment and thought content normal.       She is tearful during the conversation of history    ED Course  Procedures (including  critical care time)    Date: 05/08/2011  Rate: 99  Rhythm: normal sinus rhythm  QRS Axis: normal  Intervals: normal  ST/T Wave abnormalities: normal  Conduction Disutrbances:none  Narrative Interpretation:   Old EKG Reviewed: unchanged Patient was medically cleared for evaluation and treatment, at the psychiatric services.  The patient was seen by him. The assessment and crisis team. After consulting with the patient's daughter, it was determined that she was suicidal. Involuntary commitment papers were filled out because the patient did not want to stay. The crisis team is  working on placement  Labs Reviewed  URINALYSIS, ROUTINE W REFLEX MICROSCOPIC - Abnormal; Notable for the following:    APPearance CLOUDY (*)    Hgb urine dipstick LARGE (*)    Leukocytes, UA TRACE (*)    All other components within normal limits  SALICYLATE LEVEL - Abnormal; Notable for the following:    Salicylate Lvl <2.0 (*)    All other components within normal limits  ETHANOL - Abnormal; Notable for the following:    Alcohol, Ethyl (B) 46 (*)    All other components within normal limits  CBC - Abnormal; Notable for the following:    WBC 11.0 (*)    All other components within normal limits  DIFFERENTIAL - Abnormal; Notable for the following:    Neutro Abs 8.3 (*)    All other components within normal limits  BASIC METABOLIC PANEL - Abnormal; Notable for the following:    Potassium 3.0 (*)    Glucose, Bld 106 (*)    All other components within normal limits  AMYLASE - Abnormal; Notable for the following:    Amylase 134 (*)    All other components within normal limits  POCT I-STAT, CHEM 8 - Abnormal; Notable for the following:    Potassium 3.1 (*)    Glucose, Bld 113 (*)    Hemoglobin 15.3 (*)    All other components within normal limits  URINE MICROSCOPIC-ADD ON - Abnormal; Notable for the following:    Bacteria, UA MANY (*)    All other components within normal limits  ACETAMINOPHEN LEVEL  POCT PREGNANCY, URINE  GLUCOSE, CAPILLARY  URINE RAPID DRUG SCREEN (HOSP PERFORMED)   No results found.   1. Suicidal ideation       MDM  Nontoxic ingestion taken as suicidal attempt. Patient is persistently suicidal and has worked psychosocial stressors. She needs to be admitted for management to a psychiatric service.   Plan: Telepsychiatric consult       Flint Melter, MD 05/12/11 [phone]

## 2011-05-08 NOTE — ED Notes (Signed)
Per ems pt is from home. Alert and oriented x4, ambulatory. Pt reports that her husband cheated on her, problems with the mother in law and pt just wanted everyone to leave her alone. Pt was found by family in her room "unresponsive" with a note that stated "I just loved him, and I love all of you." Pt denies wanting to hurt herself. States "I just had a bad day". Pt took venlafaxine, pt reports she took 3 pills with ETOH (wine cooler). The bottle was filled 2/4, quanity 30, at present 1 pill remains.  Upon arrival pt is alert and oriented x4 and stable. tearful

## 2011-05-08 NOTE — ED Notes (Signed)
Daughter at bedside. Pt alert and oriented x4. Respirations even and unlabored, bilateral symmetrical rise and fall of chest. Skin warm and dry. In no acute distress. Denies needs.

## 2011-05-08 NOTE — BH Assessment (Signed)
Assessment Note   [name] is an 41 y.o. female who presents voluntarily to [facility] via EMS. Per EMS, family member called b/c pt "unresponsive" after taking pills. Per pt's family, pt left note stating, "I just loved him and I love you all". Pt denies it was suicide letter. Pt reports taking 3 Effexor XR pills because she was upset re: her estranged husband's mean behavior. Pt denies SI and HI. Pt reports one previous suicide attempt May 2012 when she was admitted to [facility]. Pt states she "hit bottom today" in terms of anxiety. Pt endorses depressed mood with insomnia, worthlessness, despondency, fatigue, tearfulness and loss of interest. Pt reports panic attacks 2-3 times per week. Pt denies A/VH and no delusions noted. Current stressors include her relatinship with her estranged husband and work stress (works at [employer] in [place]). Pt's daughter present during assessment states she is worried about pt's safety. Daughter states pt drinks daily (50 oz) and has done for several mos. Pt states she only drinks on weekends and had her 1st drink at 24. Pt fell and broke shoulder 8 wks ago. Pt states she could contract for safety. Per daughter when speaking w/ RN earlier today: A few months ago pt was suicidal and IVC papers were taken out,because pt sent texts and phone calls related to being suicidal, but dr discharged pt home, pt is very manipulative.  Axis I: Major Depressive D/O, Severe           Anxiety D/O NOS           Alcohol Abuse Axis II: Deferred Axis III:  Past Medical History  Diagnosis Date  . Depressed   . Anxiety   . Deep vein thrombosis    Axis IV: other psychosocial or environmental problems, problems related to social environment and problems with primary support group Axis V: 41-50 serious symptoms  Past Medical History:  Past Medical History  Diagnosis Date  . Depressed   . Anxiety   . Deep vein thrombosis     Past Surgical History  Procedure Date  . Abdominal hysterectomy    . Endovein harvest of greater saphenous vein     Family History: No family history on file.  Social History:  reports that she has been smoking.  She does not have any smokeless tobacco history on file. She reports that she does not drink alcohol or use illicit drugs.  Additional Social History:  Alcohol / Drug Use Pain Medications: n/a Prescriptions: as prescribed Over the Counter: n/a History of alcohol / drug use?: Yes Substance #1 Name of Substance 1: alcohol 1 - Age of First Use: 24 1 - Amount (size/oz): 50 oz liquor and wine coolers 1 - Frequency: daily 1 - Duration: months 1 - Last Use / Amount: 05/08/11 - 1 wine cooler Allergies:  Allergies  Allergen Reactions  . Codeine Nausea And Vomiting    Can take hydrocodone    Home Medications:  Medications Prior to Admission  Medication Dose Route Frequency Provider Last Rate Last Dose  . acetaminophen (TYLENOL) tablet 650 mg  650 mg Oral Once Flint Melter, MD   650 mg at 05/08/11 1855   Medications Prior to Admission  Medication Sig Dispense Refill  . ALPRAZolam (XANAX) 1 MG tablet Take 1 mg by mouth 3 (three) times daily as needed. Anxiety.        OB/GYN Status:  No LMP recorded. Patient has had a hysterectomy.  General Assessment Data Location of Assessment: [facility] ED  Living Arrangements: Alone Can pt return to current living arrangement?: Yes Admission Status: Voluntary Is patient capable of signing voluntary admission?: Yes Transfer from: Home Referral Source:  (ems)  Education Status Is patient currently in school?: No Highest grade of school patient has completed: some college Name of school: [employer]  Risk to self Suicidal Ideation: No-Not Currently/Within Last 6 Months Suicidal Intent: No-Not Currently/Within Last 6 Months Is patient at risk for suicide?: Yes Suicidal Plan?: No-Not Currently/Within Last 6 Months Access to Means: Yes Specify Access to Suicidal Means: pills What has been your use of  drugs/alcohol within the last 12 months?: daily drinker Previous Attempts/Gestures: Yes How many times?: 1  Other Self Harm Risks: n/a Triggers for Past Attempts: Other (Comment) (marital discord) Intentional Self Injurious Behavior: None Family Suicide History: No Recent stressful life event(s): Other (Comment) (marital discord/job stress) Persecutory voices/beliefs?: No Depression: Yes Depression Symptoms: Despondent;Insomnia;Fatigue;Loss of interest in usual pleasures;Tearfulness Substance abuse history and/or treatment for substance abuse?: No Suicide prevention information given to non-admitted patients: Not applicable  Risk to Others Homicidal Ideation: No-Not Currently/Within Last 6 Months Thoughts of Harm to Others: No Current Homicidal Intent: No Current Homicidal Plan: No Access to Homicidal Means: No Identified Victim: n/a History of harm to others?: No Assessment of Violence: None Noted Violent Behavior Description: n/a Does patient have access to weapons?: No Criminal Charges Pending?: No Does patient have a court date: No  Psychosis Hallucinations: None noted Delusions: None noted  Mental Status Report Appear/Hygiene:  (unremarkable) Eye Contact: Good Motor Activity: Freedom of movement Speech: Logical/coherent Level of Consciousness: Alert;Crying Mood: Depressed;Anxious;Sad Affect: Appropriate to circumstance;Sad Anxiety Level: Panic Attacks Panic attack frequency: 2-3 week Most recent panic attack: unsure Thought Processes: Relevant;Coherent Judgement: Impaired Orientation: Person;Place;Time;Situation Obsessive Compulsive Thoughts/Behaviors: None  Cognitive Functioning Concentration: Decreased Memory: Remote Intact;Recent Impaired IQ: Average Insight: Poor Impulse Control: Fair Appetite: Fair Weight Loss: 0  Weight Gain: 0  Sleep: Decreased Total Hours of Sleep: 3  Vegetative Symptoms: None  Prior Inpatient Therapy Prior Inpatient Therapy:  Yes Prior Therapy Dates: may 2012 Prior Therapy Facilty/Provider(s): [facility] Reason for Treatment: suicide attempt  Prior Outpatient Therapy Prior Outpatient Therapy: Yes Prior Therapy Dates: currently Prior Therapy Facilty/Provider(s): Velta Addison MD Reason for Treatment: depression  ADL Screening (condition at time of admission) Patient's cognitive ability adequate to safely complete daily activities?: Yes Patient able to express need for assistance with ADLs?: Yes Independently performs ADLs?: Yes Weakness of Legs: None Weakness of Arms/Hands: None  Home Assistive Devices/Equipment Home Assistive Devices/Equipment: None    Abuse/Neglect Assessment (Assessment to be complete while patient is alone) Physical Abuse: Denies Verbal Abuse: Denies Sexual Abuse: Denies Exploitation of patient/patient's resources: Denies Self-Neglect: Denies Values / Beliefs Cultural Requests During Hospitalization: None Spiritual Requests During Hospitalization: None   Advance Directives (For Healthcare) Advance Directive: Patient does not have advance directive;Patient would not like information    Additional Information 1:1 In Past 12 Months?: No CIRT Risk: No Elopement Risk: No Does patient have medical clearance?: Yes     Disposition:  Disposition Disposition of Patient: Inpatient treatment program;Outpatient treatment Type of inpatient treatment program: Adult Type of outpatient treatment: Adult (pending telepsych)  On Site Evaluation by:   Reviewed with Physician:     Donnamarie Rossetti P 05/08/2011 10:46 PM

## 2011-05-09 LAB — RAPID URINE DRUG SCREEN, HOSP PERFORMED
Amphetamines: NOT DETECTED
Benzodiazepines: POSITIVE — AB
Opiates: NOT DETECTED

## 2011-05-09 MED ORDER — AMLODIPINE BESYLATE 10 MG PO TABS
10.0000 mg | ORAL_TABLET | Freq: Every day | ORAL | Status: DC
  Administered 2011-05-09: 10 mg via ORAL
  Filled 2011-05-09: qty 1

## 2011-05-09 MED ORDER — NICOTINE 21 MG/24HR TD PT24: 21.0000 mg | MEDICATED_PATCH | Freq: Every day | TRANSDERMAL | Status: DC | PRN

## 2011-05-09 MED ORDER — ONDANSETRON HCL 4 MG PO TABS: 4.0000 mg | ORAL_TABLET | Freq: Three times a day (TID) | ORAL | Status: DC | PRN

## 2011-05-09 MED ORDER — ZOLPIDEM TARTRATE 5 MG PO TABS
5.0000 mg | ORAL_TABLET | Freq: Every evening | ORAL | Status: DC | PRN
  Administered 2011-05-09: 5 mg via ORAL
  Filled 2011-05-09: qty 1

## 2011-05-09 MED ORDER — ACETAMINOPHEN 325 MG PO TABS: 650.0000 mg | ORAL_TABLET | ORAL | Status: DC | PRN

## 2011-05-09 MED ORDER — ALPRAZOLAM 0.5 MG PO TABS
1.0000 mg | ORAL_TABLET | Freq: Three times a day (TID) | ORAL | Status: DC | PRN
  Administered 2011-05-09: 1 mg via ORAL
  Filled 2011-05-09: qty 2

## 2011-05-09 MED ORDER — CLONIDINE HCL 0.1 MG PO TABS
0.1000 mg | ORAL_TABLET | Freq: Once | ORAL | Status: AC
  Administered 2011-05-09: 0.1 mg via ORAL
  Filled 2011-05-09: qty 1

## 2011-05-09 MED ORDER — LORAZEPAM 1 MG PO TABS
1.0000 mg | ORAL_TABLET | Freq: Once | ORAL | Status: AC
  Administered 2011-05-09: 1 mg via ORAL
  Filled 2011-05-09: qty 1

## 2011-05-09 MED ORDER — IBUPROFEN 200 MG PO TABS: 600.0000 mg | ORAL_TABLET | Freq: Three times a day (TID) | ORAL | Status: DC | PRN

## 2011-05-09 NOTE — ED Provider Notes (Signed)
Telepsych completed by Dr. Krystal Eaton who writes that pt has no need for IVC, no SI, HI.  She is lucid, cooperative, no SI voiced to me.  She has appt with PCP and with her psychiatrist all tomorrow.  Telepsych recommends xanax, zoloft continuation, but pt reports she was taken off of those meds by her current provider, and I think it is fine for her to discuss this with her provider tomorrow.  Pt is agreeable, discharged to home.    Gavin Pound. Leeandre Nordling, MD 05/09/11 1127

## 2011-05-09 NOTE — BHH Counselor (Signed)
Per shift report, patient pending completion of a telepsych consult. Patient completed the consult with Dr. Jacky Kindle with [facility] and he recommended d/c home to follow up with community provider. Writer provided patient with several referrals. Patient sts that she is already receiving services with Andee Poles (psychiatrist). Patient agrees to follow up with Dr. Nolen Mu. Patient given additional referrals.

## 2011-05-09 NOTE — ED Notes (Signed)
Tele psych done, pt resting comfortably

## 2011-05-09 NOTE — BH Assessment (Signed)
Magistrate Bing Plume confirmed receipt of IVC paperwork.

## 2011-05-09 NOTE — Discharge Instructions (Signed)

## 2011-05-09 NOTE — BH Assessment (Signed)
Telepsych consult ordered by phone and paperwork faxed.

## 2011-12-06 ENCOUNTER — Other Ambulatory Visit: Payer: Self-pay | Admitting: Physician Assistant

## 2011-12-06 DIAGNOSIS — Z1231 Encounter for screening mammogram for malignant neoplasm of breast: Secondary | ICD-10-CM

## 2011-12-16 ENCOUNTER — Ambulatory Visit: Payer: 59

## 2012-01-19 ENCOUNTER — Ambulatory Visit: Payer: 59

## 2013-12-07 ENCOUNTER — Emergency Department: Payer: 59

## 2013-12-07 ENCOUNTER — Encounter: Payer: Self-pay | Admitting: Emergency Medicine

## 2013-12-07 ENCOUNTER — Emergency Department
Admission: EM | Admit: 2013-12-07 | Discharge: 2013-12-07 | Disposition: A | Discharge status: Home / Self Care | Payer: 59 | Source: Home / Self Care | Attending: Family Medicine | Admitting: Family Medicine

## 2013-12-07 DIAGNOSIS — R112 Nausea with vomiting, unspecified: Secondary | ICD-10-CM

## 2013-12-07 DIAGNOSIS — R197 Diarrhea, unspecified: Secondary | ICD-10-CM

## 2013-12-07 HISTORY — DX: Essential (primary) hypertension: I10

## 2013-12-07 LAB — POCT URINALYSIS DIP (MANUAL ENTRY)
NITRITE UA: NEGATIVE
PH UA: 5 (ref 5–8)
Protein Ur, POC: 30
UROBILINOGEN UA: 0.2 (ref 0–1)

## 2013-12-07 MED ORDER — ONDANSETRON HCL 8 MG PO TABS
8.0000 mg | ORAL_TABLET | Freq: Once | ORAL | Status: AC
  Administered 2013-12-07: 8 mg via ORAL

## 2013-12-07 MED ORDER — ONDANSETRON 4 MG PO TBDP: ORAL_TABLET | ORAL | Status: AC

## 2013-12-07 MED ORDER — CEPHALEXIN 500 MG PO CAPS: 500.0000 mg | ORAL_CAPSULE | Freq: Two times a day (BID) | ORAL | Status: DC

## 2013-12-07 NOTE — ED Provider Notes (Signed)
CSN: [id number]     Arrival date & time 12/07/13  1349 History   First MD Initiated Contact with Patient 12/07/13 1425     Chief Complaint  Patient presents with  . Fever  . Nausea  . Emesis  . Diarrhea  . Fatigue     HPI Comments: Three days ago patient awoke with nausea/vomiting, fatigue, watery diarrhea, and fever to 102, now resolved, although she has occasional chills.  She has felt light-headed.  She has continued to try to eat.  No hematochezia.  Denies recent foreign travel, or drinking untreated water in a wilderness environment.  She denies recent antibiotic use.   Patient is a 43 y.o. female presenting with vomiting. The history is provided by the patient and a relative.  Emesis Severity:  Moderate Duration:  3 days Timing:  Intermittent Number of daily episodes:  10 Quality:  Stomach contents Able to tolerate:  Liquids Progression:  Unchanged Chronicity:  New Recent urination:  Decreased Relieved by:  Nothing Worsened by:  Food smell Ineffective treatments:  None tried Associated symptoms: abdominal pain, chills, diarrhea and fever   Associated symptoms: no arthralgias, no cough, no headaches, no myalgias, no sore throat and no URI   Diarrhea:    Quality:  Watery   Severity:  Moderate   Duration:  3 days   Timing:  Intermittent   Progression:  Unchanged Risk factors: no sick contacts, no suspect food intake and no travel to endemic areas     Past Medical History  Diagnosis Date  . Depressed   . Anxiety   . Deep vein thrombosis   . Hypertension    Past Surgical History  Procedure Laterality Date  . Abdominal hysterectomy    . Endovein harvest of greater saphenous vein     Family History  Problem Relation Age of Onset  . Diabetes Mother   . Hypertension Mother   . Diabetes Father   . Hypertension Father    History  Substance Use Topics  . Smoking status: Current Every Day Smoker -- 0.50 packs/day  . Smokeless tobacco: Not on file  . Alcohol Use:  No   OB History    No data available     Review of Systems  Constitutional: Positive for chills.  HENT: Negative for sore throat.   Gastrointestinal: Positive for vomiting, abdominal pain and diarrhea.  Musculoskeletal: Negative for myalgias and arthralgias.  Neurological: Negative for headaches.  All other systems reviewed and are negative.   Allergies  Codeine  Home Medications   Prior to Admission medications   Medication Sig Start Date End Date Taking? Authorizing Provider  lisinopril (PRINIVIL,ZESTRIL) 10 MG tablet Take 10 mg by mouth daily.   Yes Historical Provider, MD  ALPRAZolam Prudy Feeler(XANAX) 1 MG tablet Take 1 mg by mouth 3 (three) times daily as needed. Anxiety.    Historical Provider, MD  cephALEXin (KEFLEX) 500 MG capsule Take 1 capsule (500 mg total) by mouth 2 (two) times daily. 12/07/13   Lattie HawStephen A Vedh Ptacek, MD  HYDROcodone-acetaminophen (NORCO) 5-325 MG per tablet Take 1 tablet by mouth See admin instructions. Every 6 to 8 hours as needed for pain.    Historical Provider, MD  ondansetron (ZOFRAN ODT) 4 MG disintegrating tablet Take one tab by mouth Q6hr prn nausea 12/07/13   Lattie HawStephen A Keyron Pokorski, MD  venlafaxine XR (EFFEXOR-XR) 75 MG 24 hr capsule Take 75 mg by mouth daily.    Historical Provider, MD   BP 138/94 mmHg  Pulse 106  Temp(Src) 97.8 F (36.6 C) (Oral)  Ht 5\' 5"  (1.651 m)  Wt 160 lb (72.576 kg)  BMI 26.63 kg/m2  SpO2 98% Physical Exam Nursing notes and Vital Signs reviewed. Appearance:  Patient appears stated age, and in no acute distress.  She appears uncomfortable but is alert and oriented.  Eyes:  Pupils are equal, round, and reactive to light and accomodation.  Extraocular movement is intact.  Conjunctivae are not inflamed  Ears:  Canals normal.  Tympanic membranes normal.  Nose:   Normal turbinates.  No sinus tenderness.   Pharynx:  Normal; moist mucous membranes although decreased Neck:  Supple.  No adenopathy Lungs:  Clear to auscultation.  Breath  sounds are equal.  Heart:  Regular rate and rhythm without murmurs, rubs, or gallops.  Abdomen:  Nontender without masses or hepatosplenomegaly.  Bowel sounds are present and incrased.  No CVA or flank tenderness.  Extremities:  No edema.  No calf tenderness Skin:  No rash present.    ED Course  Procedures  None    Labs Reviewed  POCT URINALYSIS DIP (MANUAL ENTRY):  GLU 100mg /dL; BIL small; KET trace; SG >= 1.030; BLO moderate; PRO 30mg /dL; LEU trace         MDM   1. Nausea and vomiting, vomiting of unspecified type; suspect viral gastroenteritis   2. Diarrhea    Zofran ODT 8mg .  Rx for Zofran ODT 4mg   Urine culture pending.  Begin Keflex. Begin clear liquids (Pedialyte while having diarrhea) until improved, then advance to a SUPERVALU INCBRAT diet (Bananas, Rice, Applesauce, Toast).  Then gradually resume a regular diet when tolerated.  Avoid milk products until well.  To decrease diarrhea, mix one teaspoon Citrucel (methylcellulose) in 2 oz water and drink one to three times daily.  Do not drink extra fluids with this dose and do not drink fluids for one hour afterwards.  When stools become more formed, may take Imodium (loperamide) once or twice daily to decrease stool frequency.  If symptoms become significantly worse during the night or over the weekend, proceed to the local emergency room.  Followup with Family Doctor if not improved in 3 days     Lattie HawStephen A Ohm Dentler, MD 12/09/13 [phone]

## 2013-12-07 NOTE — ED Notes (Signed)
Reports being ill with fever, which became normal yesterday; vomiting and diarrhea x 3 days.

## 2013-12-07 NOTE — Discharge Instructions (Signed)

## 2013-12-10 ENCOUNTER — Telehealth: Payer: Self-pay | Admitting: *Deleted

## 2014-03-20 ENCOUNTER — Other Ambulatory Visit: Payer: Self-pay | Admitting: Obstetrics and Gynecology

## 2014-03-21 LAB — CYTOLOGY - PAP

## 2014-11-14 ENCOUNTER — Other Ambulatory Visit: Payer: Self-pay | Admitting: Obstetrics and Gynecology

## 2014-11-14 DIAGNOSIS — N6452 Nipple discharge: Secondary | ICD-10-CM

## 2014-11-19 ENCOUNTER — Ambulatory Visit
Admission: RE | Admit: 2014-11-19 | Discharge: 2014-11-19 | Disposition: A | Discharge status: Home / Self Care | Payer: 59 | Source: Ambulatory Visit | Attending: Obstetrics and Gynecology | Admitting: Obstetrics and Gynecology

## 2014-11-19 ENCOUNTER — Ambulatory Visit
Admission: RE | Admit: 2014-11-19 | Discharge: 2014-11-19 | Disposition: A | Discharge status: Home / Self Care | Payer: Commercial Managed Care - HMO | Source: Ambulatory Visit | Attending: Obstetrics and Gynecology | Admitting: Obstetrics and Gynecology

## 2014-11-19 DIAGNOSIS — N6452 Nipple discharge: Secondary | ICD-10-CM

## 2015-10-28 ENCOUNTER — Other Ambulatory Visit: Payer: Self-pay | Admitting: Obstetrics and Gynecology

## 2015-10-28 DIAGNOSIS — Z1231 Encounter for screening mammogram for malignant neoplasm of breast: Secondary | ICD-10-CM

## 2015-11-19 IMAGING — MG MM DIAG BREAST TOMO BILATERAL
6 of 12 series · 6 of 36 positions shown · non-contrast
Comparison: 03/20/2014 and earlier.

CLINICAL DATA: 44-year-old with chronic bilateral clear nipple
discharge which has recently increased in frequency and volume.

EXAM:
DIGITAL DIAGNOSTIC BILATERAL MAMMOGRAM WITH 3D TOMOSYNTHESIS AND CAD

[L MLO (1 of 2)]
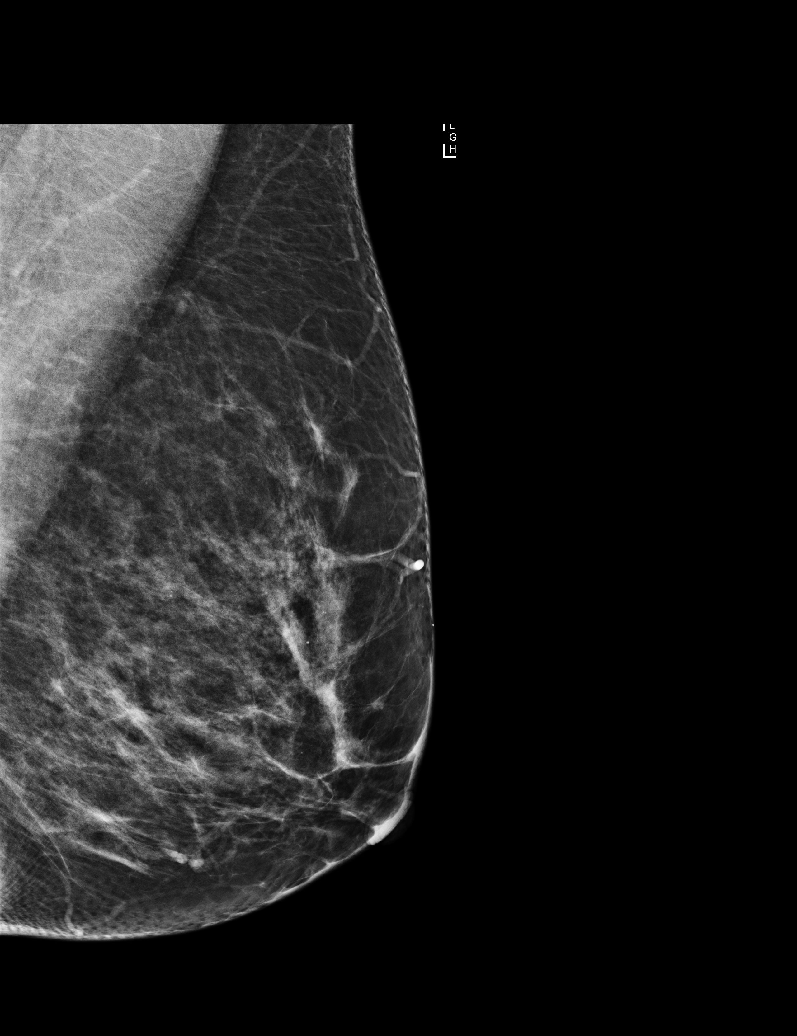

[L MLO (2 of 2)]
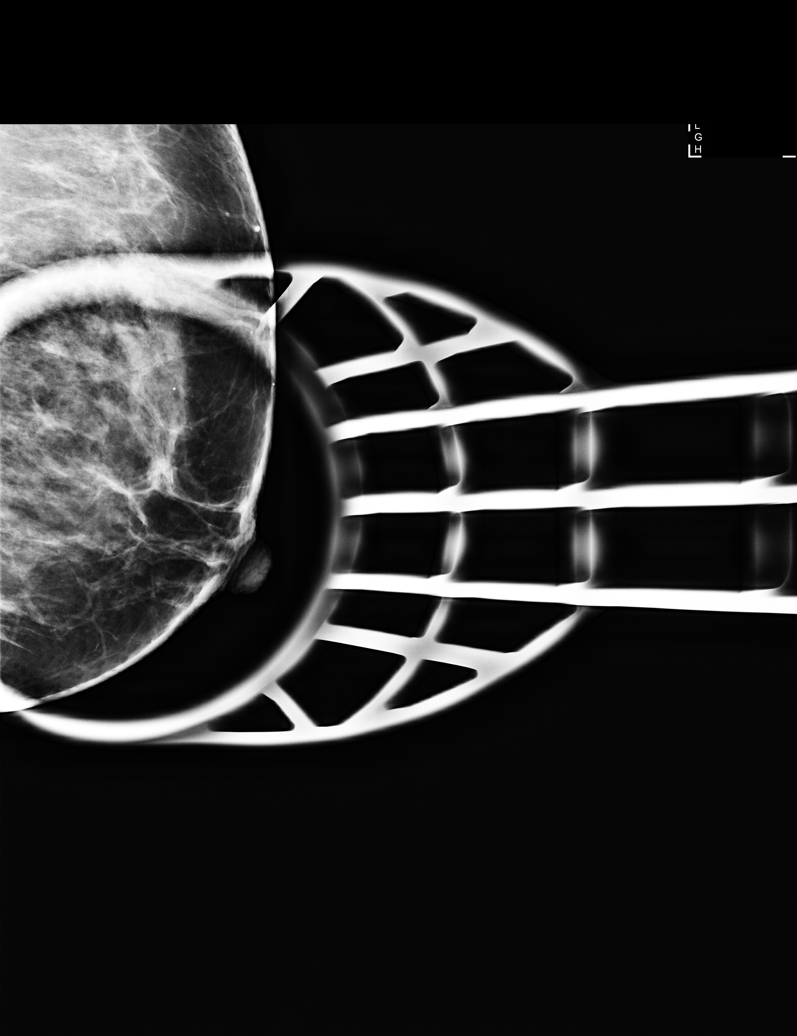

[R MLO (1 of 2)]
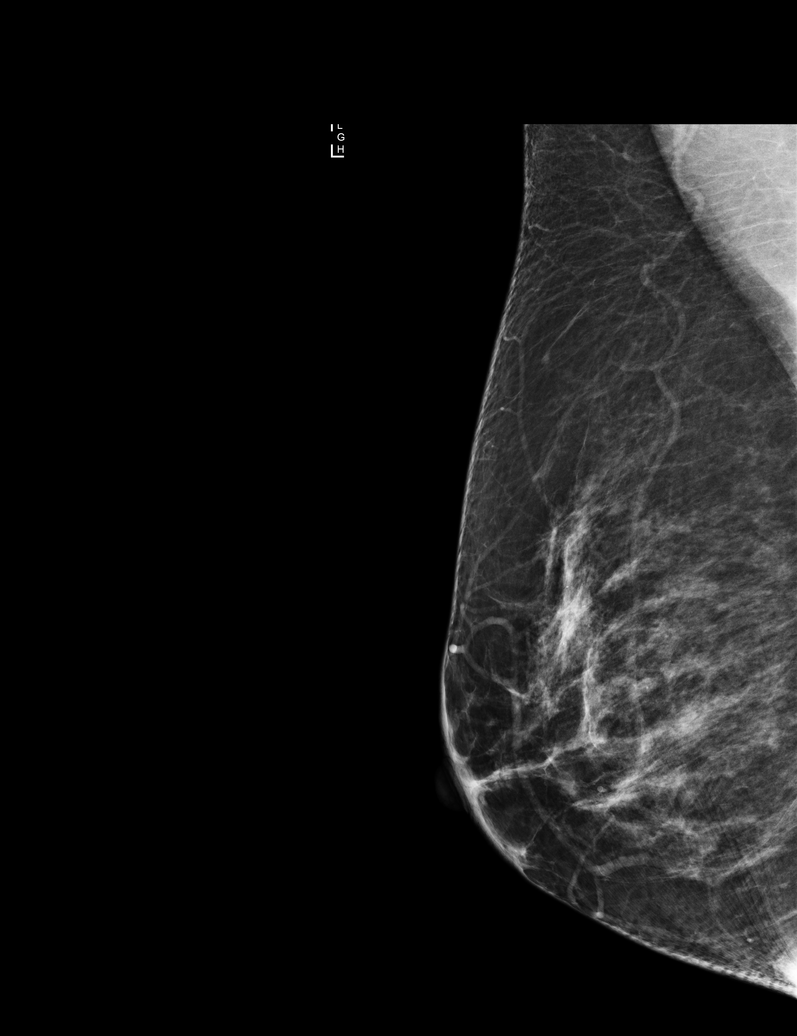

[R MLO (2 of 2)]
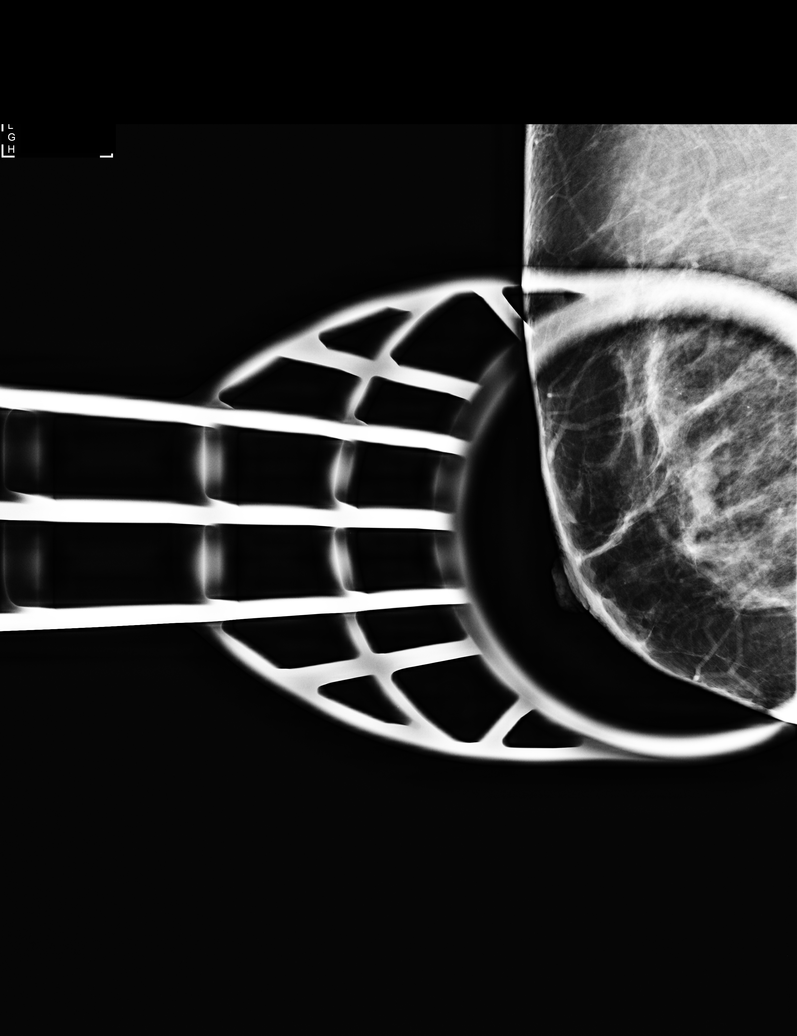

[R CC]
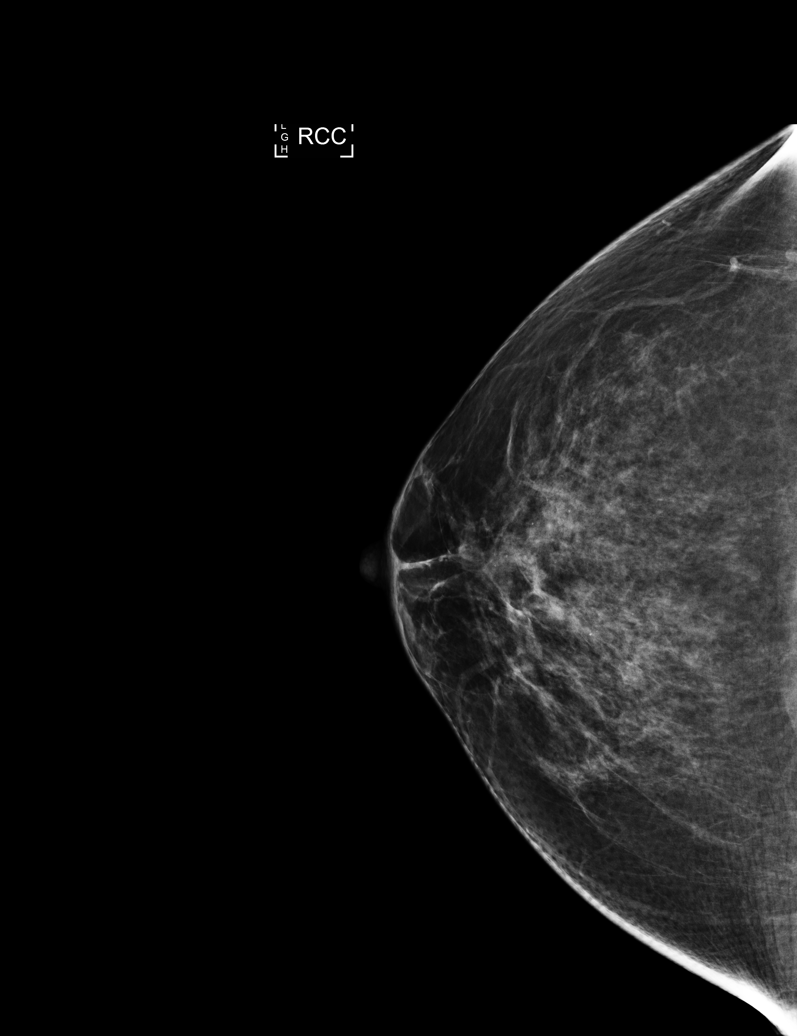

[L CC]
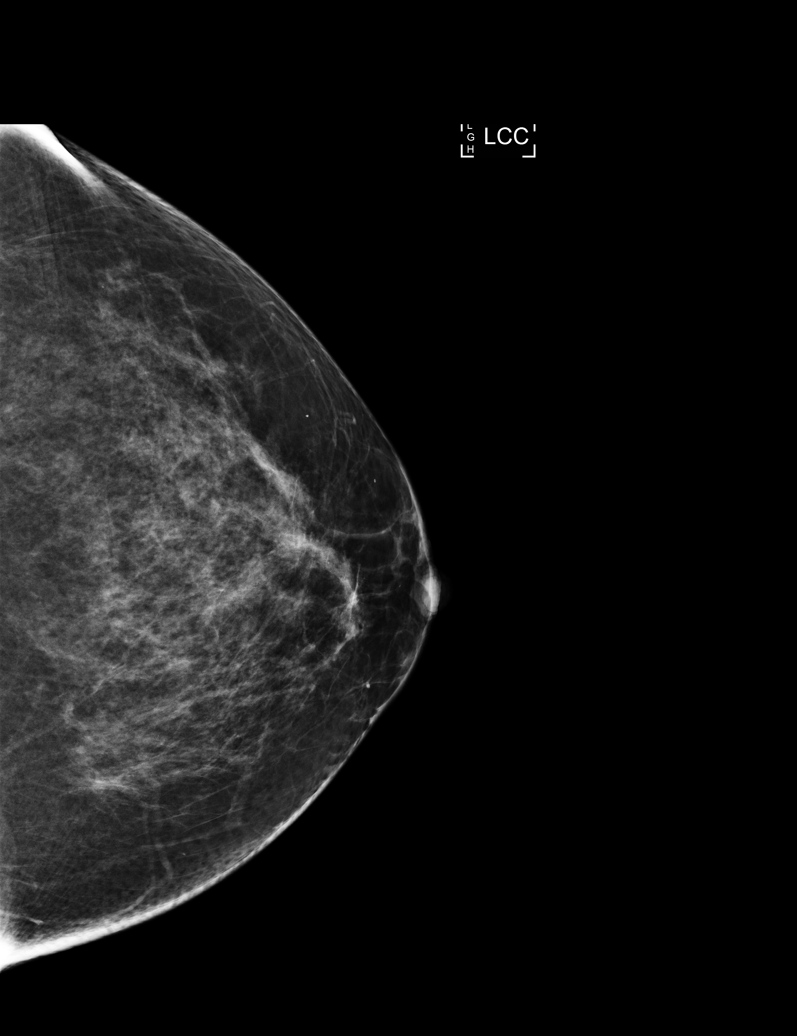

[6 of 36 positions shown; findings below may reference images not displayed]

ACR Breast Density Category b: There are scattered areas of
fibroglandular density.
FINDINGS: CC and MLO views of both breasts and spot compression views behind
both nipples were obtained, all with tomosynthesis.

Spot compression views demonstrate no dilated duct or other
significant abnormality in the retroareolar region of either breast.
Multiple circumscribed masses in both breasts, a benign finding. No
findings suspicious for malignancy in either breast.

Mammographic images were processed with CAD.
IMPRESSION: 1. No mammographic evidence of malignancy in either breast.
2. Please see the separate report of the right breast ductogram
which was performed subsequently.

RECOMMENDATION:
Screening mammogram in one year.(Code:[id number])

I have discussed the findings and recommendations with the patient.
Results were also provided in writing at the conclusion of the
visit. If applicable, a reminder letter will be sent to the patient
regarding the next appointment.

BI-RADS CATEGORY  2: Benign.

## 2015-11-24 ENCOUNTER — Ambulatory Visit: Payer: 59

## 2015-12-24 ENCOUNTER — Ambulatory Visit: Payer: 59

## 2016-04-24 ENCOUNTER — Emergency Department (HOSPITAL_BASED_OUTPATIENT_CLINIC_OR_DEPARTMENT_OTHER): Payer: Commercial Managed Care - HMO

## 2016-04-24 ENCOUNTER — Emergency Department (HOSPITAL_BASED_OUTPATIENT_CLINIC_OR_DEPARTMENT_OTHER)
Admission: EM | Admit: 2016-04-24 | Discharge: 2016-04-24 | Disposition: A | Discharge status: Home / Self Care | Payer: Commercial Managed Care - HMO | Attending: Emergency Medicine | Admitting: Emergency Medicine

## 2016-04-24 ENCOUNTER — Encounter (HOSPITAL_BASED_OUTPATIENT_CLINIC_OR_DEPARTMENT_OTHER): Payer: Self-pay | Admitting: *Deleted

## 2016-04-24 DIAGNOSIS — N898 Other specified noninflammatory disorders of vagina: Secondary | ICD-10-CM | POA: Diagnosis present

## 2016-04-24 DIAGNOSIS — N760 Acute vaginitis: Secondary | ICD-10-CM | POA: Insufficient documentation

## 2016-04-24 DIAGNOSIS — B9689 Other specified bacterial agents as the cause of diseases classified elsewhere: Secondary | ICD-10-CM | POA: Diagnosis not present

## 2016-04-24 DIAGNOSIS — M533 Sacrococcygeal disorders, not elsewhere classified: Secondary | ICD-10-CM | POA: Insufficient documentation

## 2016-04-24 DIAGNOSIS — Z79899 Other long term (current) drug therapy: Secondary | ICD-10-CM | POA: Insufficient documentation

## 2016-04-24 DIAGNOSIS — I10 Essential (primary) hypertension: Secondary | ICD-10-CM | POA: Insufficient documentation

## 2016-04-24 DIAGNOSIS — F17200 Nicotine dependence, unspecified, uncomplicated: Secondary | ICD-10-CM | POA: Diagnosis not present

## 2016-04-24 DIAGNOSIS — R109 Unspecified abdominal pain: Secondary | ICD-10-CM | POA: Diagnosis not present

## 2016-04-24 LAB — CBC WITH DIFFERENTIAL/PLATELET
BASOS PCT: 1 %
Basophils Absolute: 0.1 10*3/uL (ref 0.0–0.1)
Eosinophils Absolute: 0.1 10*3/uL (ref 0.0–0.7)
Eosinophils Relative: 1 %
HEMATOCRIT: 41.8 % (ref 36.0–46.0)
HEMOGLOBIN: 14.3 g/dL (ref 12.0–15.0)
Lymphocytes Relative: 28 %
Lymphs Abs: 2.4 10*3/uL (ref 0.7–4.0)
MCH: 29.7 pg (ref 26.0–34.0)
MCHC: 34.2 g/dL (ref 30.0–36.0)
MCV: 86.7 fL (ref 78.0–100.0)
MONOS PCT: 8 %
Monocytes Absolute: 0.7 10*3/uL (ref 0.1–1.0)
NEUTROS ABS: 5.4 10*3/uL (ref 1.7–7.7)
NEUTROS PCT: 62 %
Platelets: 262 10*3/uL (ref 150–400)
RBC: 4.82 MIL/uL (ref 3.87–5.11)
RDW: 13.3 % (ref 11.5–15.5)
WBC: 8.6 10*3/uL (ref 4.0–10.5)

## 2016-04-24 LAB — URINALYSIS, MICROSCOPIC (REFLEX)

## 2016-04-24 LAB — URINALYSIS, ROUTINE W REFLEX MICROSCOPIC
Bilirubin Urine: NEGATIVE
Glucose, UA: NEGATIVE mg/dL
KETONES UR: NEGATIVE mg/dL
Nitrite: NEGATIVE
Protein, ur: NEGATIVE mg/dL
SPECIFIC GRAVITY, URINE: 1.023 (ref 1.005–1.030)
pH: 5.5 (ref 5.0–8.0)

## 2016-04-24 LAB — COMPREHENSIVE METABOLIC PANEL
ALK PHOS: 111 U/L (ref 38–126)
ALT: 25 U/L (ref 14–54)
ANION GAP: 8 (ref 5–15)
AST: 23 U/L (ref 15–41)
Albumin: 4.6 g/dL (ref 3.5–5.0)
BUN: 15 mg/dL (ref 6–20)
CALCIUM: 9.4 mg/dL (ref 8.9–10.3)
CO2: 29 mmol/L (ref 22–32)
CREATININE: 0.73 mg/dL (ref 0.44–1.00)
Chloride: 100 mmol/L — ABNORMAL LOW (ref 101–111)
GFR calc Af Amer: >60 mL/min (ref >60)
GFR calc non Af Amer: >60 mL/min (ref >60)
GLUCOSE: 103 mg/dL — AB (ref 65–99)
Potassium: 3.9 mmol/L (ref 3.5–5.1)
Sodium: 137 mmol/L (ref 135–145)
TOTAL PROTEIN: 8.2 g/dL — AB (ref 6.5–8.1)
Total Bilirubin: 0.6 mg/dL (ref 0.3–1.2)

## 2016-04-24 LAB — WET PREP, GENITAL
SPERM: NONE SEEN
TRICH WET PREP: NONE SEEN
YEAST WET PREP: NONE SEEN

## 2016-04-24 LAB — PREGNANCY, URINE: Preg Test, Ur: NEGATIVE

## 2016-04-24 MED ORDER — METRONIDAZOLE 500 MG PO TABS: 500.0000 mg | ORAL_TABLET | Freq: Two times a day (BID) | ORAL | 0 refills | Status: DC

## 2016-04-24 NOTE — ED Notes (Signed)
ED Provider at bedside performing pelvic exam. 

## 2016-04-24 NOTE — ED Notes (Signed)
Patient transported to CT 

## 2016-04-24 NOTE — ED Provider Notes (Signed)
[facility] Provider Note   CSN: [id number] Arrival date & time: 04/24/16  0810     History   Chief Complaint Chief Complaint  Patient presents with  . Back Pain  . Vaginal Discharge    HPI [name] is a 46 y.o. female.  The history is provided by the patient. No language interpreter was used.  Back Pain    Vaginal Discharge     [name] is a 46 y.o. female who presents to the Emergency Department complaining of Low back pain. She reports several months of right lower back pain described as a throbbing and stabbing sensation. Pain is worse with movement and activity. She also endorses several months of intermittent brown vaginal discharge. She notes that the discharge seems worse when her back pain is worse. She denies any fevers, chest pain, shortness breath, nausea, vomiting, abdominal pain, dysuria. No numbness or weakness. No known injuries to her back. She has not been evaluated for this back pain. She tried Tylenol yesterday for the pain but has not taken any additional medications and has not seen an OB for evaluation of back pain. She is sexually active with one partner for the last 18 years and does not use protection. She has a history of partial hysterectomy.  Past Medical History:  Diagnosis Date  . Anxiety   . Deep vein thrombosis (HCC)   . Depressed   . Hypertension     There are no active problems to display for this patient.   Past Surgical History:  Procedure Laterality Date  . ABDOMINAL HYSTERECTOMY    . BLADDER SUSPENSION    . ENDOVEIN HARVEST OF GREATER SAPHENOUS VEIN      OB History    No data available       Home Medications    Prior to Admission medications   Medication Sig Start Date End Date Taking? Authorizing Provider  ALPRAZolam Prudy Feeler) 1 MG tablet Take 1 mg by mouth 3 (three) times daily as needed. Anxiety.    Historical Provider, MD  cephALEXin (KEFLEX) 500 MG capsule Take 1 capsule (500 mg total) by mouth 2  (two) times daily. 12/07/13   Lattie Haw, MD  HYDROcodone-acetaminophen (NORCO) 5-325 MG per tablet Take 1 tablet by mouth See admin instructions. Every 6 to 8 hours as needed for pain.    Historical Provider, MD  lisinopril (PRINIVIL,ZESTRIL) 10 MG tablet Take 10 mg by mouth daily.    Historical Provider, MD  metroNIDAZOLE (FLAGYL) 500 MG tablet Take 1 tablet (500 mg total) by mouth 2 (two) times daily. 04/24/16   Tilden Fossa, MD  ondansetron (ZOFRAN ODT) 4 MG disintegrating tablet Take one tab by mouth Q6hr prn nausea 12/07/13   Lattie Haw, MD  venlafaxine XR (EFFEXOR-XR) 75 MG 24 hr capsule Take 75 mg by mouth daily.    Historical Provider, MD    Family History Family History  Problem Relation Age of Onset  . Diabetes Mother   . Hypertension Mother   . Diabetes Father   . Hypertension Father     Social History Social History  Substance Use Topics  . Smoking status: Current Every Day Smoker    Packs/day: 0.50  . Smokeless tobacco: Never Used  . Alcohol use Yes     Comment: weekends     Allergies   Codeine   Review of Systems Review of Systems  Genitourinary: Positive for vaginal discharge.  Musculoskeletal: Positive for back pain.  All other systems reviewed  and are negative.    Physical Exam Updated Vital Signs BP (!) 159/101 (BP Location: Left Arm)   Pulse 79   Temp 98 F (36.7 C) (Oral)   Resp 16   Ht  (1.651 m)   Wt 165 lb (74.8 kg)   SpO2 100%   BMI 27.46 kg/m   Physical Exam  Constitutional: She is oriented to person, place, and time. She appears well-developed and well-nourished.  HENT:  Head: Normocephalic and atraumatic.  Cardiovascular: Normal rate and regular rhythm.   No murmur heard. Pulmonary/Chest: Effort normal and breath sounds normal. No respiratory distress.  Abdominal: Soft. There is no tenderness. There is no rebound and no guarding.  Genitourinary:  Genitourinary Comments: Vaginal cuff intact.  Scant white vaginal  discharge.  No adnexal tenderness or fullness  Musculoskeletal: She exhibits no edema or tenderness.  No CVA tenderness. No reproducible back tenderness to palpation. 2+ DP pulses in bilateral lower extremities. Negative straight leg raise.  Neurological: She is alert and oriented to person, place, and time.  5 out of 5 strength in bilateral lower extremities with sensation to light touch intact in bilateral lower extremities.  Skin: Skin is warm and dry.     Psychiatric: She has a normal mood and affect. Her behavior is normal.  Nursing note and vitals reviewed.    ED Treatments / Results  Labs (all labs ordered are listed, but only abnormal results are displayed) Labs Reviewed  WET PREP, GENITAL - Abnormal; Notable for the following:       Result Value   Clue Cells Wet Prep HPF POC PRESENT (*)    WBC, Wet Prep HPF POC FEW (*)    All other components within normal limits  URINALYSIS, ROUTINE W REFLEX MICROSCOPIC - Abnormal; Notable for the following:    APPearance CLOUDY (*)    Hgb urine dipstick MODERATE (*)    Leukocytes, UA MODERATE (*)    All other components within normal limits  URINALYSIS, MICROSCOPIC (REFLEX) - Abnormal; Notable for the following:    Bacteria, UA MANY (*)    Squamous Epithelial / LPF 6-30 (*)    All other components within normal limits  COMPREHENSIVE METABOLIC PANEL - Abnormal; Notable for the following:    Chloride 100 (*)    Glucose, Bld 103 (*)    Total Protein 8.2 (*)    All other components within normal limits  URINE CULTURE  PREGNANCY, URINE  CBC WITH DIFFERENTIAL/PLATELET  GC/CHLAMYDIA PROBE AMP ([place]) NOT AT [facility]    EKG  EKG Interpretation None       Radiology Ct Renal Stone Study  Result Date: 04/24/2016 CLINICAL DATA:  Right flank pain and back pain x several months, no previous hx kidney stones, pain worsening over last few days, NKI, pain radiating down right leg on occasion Hysterectomy EXAM: CT ABDOMEN AND PELVIS  WITHOUT CONTRAST TECHNIQUE: Multidetector CT imaging of the abdomen and pelvis was performed following the standard protocol without IV contrast. COMPARISON:  12/10/2014 FINDINGS: Lower chest: Clear lung bases.  The heart is normal size. Hepatobiliary: Normal liver. There is a single gallstone. Gallbladder is otherwise unremarkable. No bile duct dilation. Pancreas: Unremarkable. No pancreatic ductal dilatation or surrounding inflammatory changes. Spleen: Normal in size without focal abnormality. Adrenals/Urinary Tract: No adrenal masses. No renal masses or stones. No hydronephrosis. Normal ureters. Normal bladder. Stomach/Bowel: Stomach, small bowel and colon are unremarkable. Normal appendix visualized. Vascular/Lymphatic: Aortic atherosclerosis. No enlarged abdominal or pelvic lymph nodes. Reproductive:  Status post hysterectomy. No adnexal masses. Other: No abdominal wall hernia or abnormality. No abdominopelvic ascites. Musculoskeletal: No fracture or acute finding. No osteoblastic or osteolytic lesions. IMPRESSION: 1. No acute findings. No findings to account for the patient's right flank and back pain. No renal or ureteral stones or obstructive uropathy. 2. Single gallstone.  No acute cholecystitis. 3. Status post hysterectomy. 4. Mild aortic atherosclerosis. Electronically Signed   By: Amie Portland M.D.   On: 04/24/2016 09:54    Procedures Procedures (including critical care time)  Medications Ordered in ED Medications - No data to display   Initial Impression / Assessment and Plan / ED Course  I have reviewed the triage vital signs and the nursing notes.  Pertinent labs & imaging results that were available during my care of the patient were reviewed by me and considered in my medical decision making (see chart for details).     Patient here for evaluation of several months of right low back pain as well as intermittent malodorous vaginal discharge. Pelvic examination is benign outside of  mild white vaginal discharge. Wet prep does demonstrate clue cells, given her symptoms we'll treat for BV. Offered pt STI testing with RPR and HIV testing and she declined. Given her ongoing back symptoms, CT scan was obtained to evaluate for obstructing stones or masses. CT is negative for stones or masses. BMP demonstrates normal renal function. Counseled pt on home care for musculoskeletal back pain, referral to the SI joint. Discussed NSAIDs, rest, Tylenol, heat and ice, activity changes. Discussed outpatient follow-up and return precautions.  Final Clinical Impressions(s) / ED Diagnoses   Final diagnoses:  BV (bacterial vaginosis)  Sacroiliac joint pain    New Prescriptions New Prescriptions   METRONIDAZOLE (FLAGYL) 500 MG TABLET    Take 1 tablet (500 mg total) by mouth 2 (two) times daily.     Tilden Fossa, MD 04/24/16 1112

## 2016-04-24 NOTE — ED Notes (Signed)
ED Provider at bedside. 

## 2016-04-24 NOTE — ED Notes (Signed)
Pt ambulatory to BR without assistance.  

## 2016-04-24 NOTE — ED Triage Notes (Signed)
Pt reports intermittent R lower back pain xseveral months. Reports working in the yard which exacerbated problem. Denies urinary symptoms. States sometimes pain radiates down R leg. Denies fever, n/v/d. Also reports increase in vaginal discharge over past 2 weeks. Denies known exposure to STDs but reports having unprotected sex.

## 2016-04-25 LAB — URINE CULTURE: Culture: NO GROWTH

## 2016-04-25 LAB — GC/CHLAMYDIA PROBE AMP (~~LOC~~) NOT AT ARMC
Chlamydia: NEGATIVE
Neisseria Gonorrhea: NEGATIVE

## 2016-05-06 DIAGNOSIS — I10 Essential (primary) hypertension: Secondary | ICD-10-CM | POA: Diagnosis not present

## 2016-05-06 DIAGNOSIS — E785 Hyperlipidemia, unspecified: Secondary | ICD-10-CM | POA: Diagnosis not present

## 2016-08-16 DIAGNOSIS — J029 Acute pharyngitis, unspecified: Secondary | ICD-10-CM | POA: Diagnosis not present

## 2016-11-30 DIAGNOSIS — G43909 Migraine, unspecified, not intractable, without status migrainosus: Secondary | ICD-10-CM | POA: Diagnosis not present

## 2016-12-08 DIAGNOSIS — Z01419 Encounter for gynecological examination (general) (routine) without abnormal findings: Secondary | ICD-10-CM | POA: Diagnosis not present

## 2017-02-01 ENCOUNTER — Ambulatory Visit: Payer: Self-pay | Admitting: Surgery

## 2017-02-01 DIAGNOSIS — K8020 Calculus of gallbladder without cholecystitis without obstruction: Secondary | ICD-10-CM | POA: Diagnosis not present

## 2017-02-01 NOTE — H&P (Signed)
History of Present Illness [name]([name] M. Christina Twiford MD; 02/01/2017 1:52 PM) Patient words: CC: RUQ and R flank pain HPI: [name] is a pleasant 47 year old lady with a history of hypertension referred for evaluation of right upper quadrant abdominal pain.  This has been going on for approximately 8 months.  She is evaluated in 04/24/16 for this at which point she was having right upper quadrant flank pain and underwent a CT renal stone study which was remarkable for gallstones in the gallbladder.  There was no renal or ureteral stones seen.  Since that time her pain has persisted and she has intermittent sharp/crampy right upper quadrant pain can last a day or more.  This is associated nausea and vomiting.  This pain is brought on by eating fatty/greasy foods.  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  She has chills with the pain; no fevers. Labwork 04/24/16 demonstrated normal LFTs, normal WBC. PMH: Hypertension (prescribed antihypertensive but is not currently taking) PSH: Laparoscopic total abdominal hysterectomy for fibroids, 2009-still has ovaries.  No other abdominal surgeries FHx: Mother with history of uterine cancer Social: He smokes 2-3 cigarettes in the evening; social alcohol use; denies illicit drug use ROS: A comprehensive 10 system review of systems was completed with the patient and pertinent findings as noted above.  The patient is a 47 year old female.   Past Surgical History [name]([name], [place]; 02/01/2017 1:28 PM) Hysterectomy (not due to cancer) - Partial   Oral Surgery   Shoulder Surgery   Right.  Diagnostic Studies History [name]([name], [place]; 02/01/2017 1:28 PM) Colonoscopy   never Mammogram   within last year Pap Smear   1-5 years ago  Allergies [name]([name], RMA; 02/01/2017 1:30 PM) Codeine/Codeine Derivatives   Allergies Reconciled    Medication History [name]([name], [place]; 02/01/2017 1:30 PM) Medications Reconciled   Social History  [name]([name], [place]; 02/01/2017 1:28 PM) Alcohol use   Occasional alcohol use. Caffeine use   Carbonated beverages, Coffee, Tea. No drug use   Tobacco use   Current every day smoker.  Family History [name]([name], [place]; 02/01/2017 1:28 PM) Arthritis   Father, Mother, Sister. Cervical Cancer   Mother. Colon Polyps   Mother. Depression   Mother. Diabetes Mellitus   Father, Mother, Sister. Hypertension   Brother, Father, Mother, Sister. Ovarian Cancer   Mother.  Pregnancy / Birth History [name]([name], [place]; 02/01/2017 1:28 PM) Age at menarche   7 years. Age of menopause   2446-48 Contraceptive History   Oral contraceptives. Gravida   2 Irregular periods   Length (months) of breastfeeding   7-12 Maternal age   49-20 Para   2  Other Problems [name]([name], [place]; 02/01/2017 1:28 PM) Anxiety Disorder   High blood pressure   Hypercholesterolemia   Migraine Headache      Review of Systems [name]([name] RMA; 02/01/2017 1:28 PM) General Present- Appetite Loss, Chills, Fatigue and Night Sweats. Not Present- Fever, Weight Gain and Weight Loss. HEENT Present- Wears glasses/contact lenses and Yellow Eyes. Not Present- Earache, Hearing Loss, Hoarseness, Nose Bleed, Oral Ulcers, Ringing in the Ears, Seasonal Allergies, Sinus Pain, Sore Throat and Visual Disturbances. Cardiovascular Present- Chest Pain, Leg Cramps, Shortness of Breath and Swelling of Extremities. Not Present- Difficulty Breathing Lying Down, Palpitations and Rapid Heart Rate. Gastrointestinal Present- Abdominal Pain, Bloating, Gets full quickly at meals, Indigestion and Nausea. Not Present- Bloody Stool, Change in Bowel Habits, Chronic diarrhea, Constipation, Difficulty Swallowing, Excessive  gas, Hemorrhoids, Rectal Pain and Vomiting. Female Genitourinary Present- Pelvic Pain. Not Present- Frequency, Nocturia, Painful Urination and Urgency. Musculoskeletal Present- Back Pain, Muscle Weakness and Swelling of Extremities. Not Present- Joint  Pain, Joint Stiffness and Muscle Pain. Neurological Present- Weakness. Not Present- Decreased Memory, Fainting, Headaches, Numbness, Seizures, Tingling, Tremor and Trouble walking. Psychiatric Not Present- Anxiety, Bipolar, Change in Sleep Pattern, Depression, Fearful and Frequent crying. Endocrine Present- Hot flashes. Not Present- Cold Intolerance, Excessive Hunger, Hair Changes, Heat Intolerance and New Diabetes.  Vitals [name] RMA; 02/01/2017 1:29 PM) 02/01/2017 1:29 PM Weight: 164.4 lb   Height: 65 in  Body Surface Area: 1.82 m   Body Mass Index: 27.36 kg/m   Temp.: 98.4 F    Pulse: 99 (Regular)    BP: 140/90 (Sitting, Left Arm, Standard)       Physical Exam [name] M. Christina Gilmer MD; 02/01/2017 1:53 PM) The physical exam findings are as follows: Note: Constitutional: No acute distress; conversant; no deformities Eyes: Moist conjunctiva; no lid lag; anicteric sclerae; pupils equal round and reactive to light Neck: Trachea midline; no palpable thyromegaly Lungs: Normal respiratory effort; no tactile fremitus CV: Regular rate and rhythm; no palpable thrill; no pitting edema GI: Abdomen soft, nontender, uncomfortable to palpation in RUQ; no palpable hepatosplenomegaly; negative Murphy's sign MSK: Normal gait; no clubbing/cyanosis Psychiatric: Appropriate affect; alert and oriented 3 Lymphatic: No palpable cervical or axillary lymphadenopathy    Assessment & Plan [name] M. Christina Wisby MD; 02/01/2017 1:55 PM) SYMPTOMATIC CHOLELITHIASIS (K80.20) Impression: [name] is a pleasant 47yoF with hx of HTN here for evaluation of symptomatic cholelithiasis -We will schedule for laparoscopic vs open cholecystectomy; all other indicated procedures -The anatomy and physiology of the hepatobiliary tract was discussed at length with the patient. The pathophysiology of was gallbladder disease and indications for surgery were additionally discussed at length with associated pictures and  illustrations. -The planned procedure, material risks (including, but not limited to, pain, bleeding, infection, scarring, need for blood transfusion, damage to surrounding structures- blood vessels/nerves/viscus/organs, damage to bile duct, bile leak, need for additional procedures, hernia, pancreatitis, pneumonia, heart attack, stroke, death) benefits and alternatives to surgery were discussed at length. I noted a good probability that the procedure would help improve their symptoms. The patient's questions were answered to their satisfaction and they elected to proceed with surgery.  Signed electronically by Andria Meuse, MD (02/01/2017 1:55 PM)

## 2017-02-17 NOTE — Patient Instructions (Addendum)
[name]  02/17/2017   Your procedure is scheduled on: 02-22-17   [facility]  Report to Admitting at  12:15 PM   Call this number if you have problems the morning of surgery [phone]   Remember: Do not eat food or drink liquids :After Midnight.     Take these medicines the morning of surgery with A SIP OF WATER: None                                                You may not have any metal on your body including hair pins and              piercings  Do not wear jewelry, make-up, lotions, powders or perfumes, deodorant             Do not wear nail polish.  Do not shave  48 hours prior to surgery.                 Do not bring valuables to the hospital. [facility] IS NOT             RESPONSIBLE   FOR VALUABLES.  Contacts, dentures or bridgework may not be worn into surgery.      Patients discharged the day of surgery will not be allowed to drive home.  Name and phone number of your driver: [name] [phone]                Please read over the following fact sheets you were given: _____________________________________________________________________           [facility] Pre[facility] Before surgery, you can play an important role.  Because skin is not sterile, your skin needs to be as free of germs as possible.  You can reduce the number of germs on your skin by washing with CHG (chlorahexidine gluconate) soap before surgery.  CHG is an antiseptic cleaner which kills germs and bonds with the skin to continue killing germs even after washing. Please DO NOT use if you have an allergy to CHG or antibacterial soaps.  If your skin becomes reddened/irritated stop using the CHG and inform your nurse when you arrive at Short Stay. Do not shave (including legs and underarms) for at least 48 hours prior to the first CHG shower.  You may shave your face/neck. Please follow these instructions carefully:  1.  Shower  with CHG Soap the night before surgery and the  morning of Surgery.  2.  If you choose to wash your hair, wash your hair first as usual with your  normal  shampoo.  3.  After you shampoo, rinse your hair and body thoroughly to remove the  shampoo.                           4.  Use CHG as you would any other liquid soap.  You can apply chg directly  to the skin and wash                       Gently with a scrungie or clean washcloth.  5.  Apply the CHG Soap to your body ONLY  FROM THE NECK DOWN.   Do not use on face/ open                           Wound or open sores. Avoid contact with eyes, ears mouth and genitals (private parts).                       Wash face,  Genitals (private parts) with your normal soap.             6.  Wash thoroughly, paying special attention to the area where your surgery  will be performed.  7.  Thoroughly rinse your body with warm water from the neck down.  8.  DO NOT shower/wash with your normal soap after using and rinsing off  the CHG Soap.                9.  Pat yourself dry with a clean towel.            10.  Wear clean pajamas.            11.  Place clean sheets on your bed the night of your first shower and do not  sleep with pets. Day of Surgery : Do not apply any lotions/deodorants the morning of surgery.  Please wear clean clothes to the hospital/surgery center.  FAILURE TO FOLLOW THESE INSTRUCTIONS MAY RESULT IN THE CANCELLATION OF YOUR SURGERY PATIENT SIGNATURE_________________________________  NURSE SIGNATURE__________________________________  ________________________________________________________________________

## 2017-02-20 ENCOUNTER — Encounter (HOSPITAL_COMMUNITY): Payer: Self-pay

## 2017-02-20 ENCOUNTER — Encounter (HOSPITAL_COMMUNITY)
Admission: RE | Admit: 2017-02-20 | Discharge: 2017-02-20 | Disposition: A | Discharge status: Home / Self Care | Payer: 59 | Source: Ambulatory Visit | Attending: Surgery | Admitting: Surgery

## 2017-02-20 ENCOUNTER — Other Ambulatory Visit: Payer: Self-pay

## 2017-02-20 DIAGNOSIS — Z79899 Other long term (current) drug therapy: Secondary | ICD-10-CM | POA: Diagnosis not present

## 2017-02-20 DIAGNOSIS — Z86718 Personal history of other venous thrombosis and embolism: Secondary | ICD-10-CM | POA: Diagnosis not present

## 2017-02-20 DIAGNOSIS — F17200 Nicotine dependence, unspecified, uncomplicated: Secondary | ICD-10-CM | POA: Diagnosis not present

## 2017-02-20 DIAGNOSIS — I10 Essential (primary) hypertension: Secondary | ICD-10-CM | POA: Diagnosis not present

## 2017-02-20 DIAGNOSIS — K8010 Calculus of gallbladder with chronic cholecystitis without obstruction: Secondary | ICD-10-CM | POA: Diagnosis present

## 2017-02-20 DIAGNOSIS — E7800 Pure hypercholesterolemia, unspecified: Secondary | ICD-10-CM | POA: Diagnosis not present

## 2017-02-20 LAB — CBC WITH DIFFERENTIAL/PLATELET
BASOS PCT: 1 %
Basophils Absolute: 0.1 10*3/uL (ref 0.0–0.1)
EOS ABS: 0.1 10*3/uL (ref 0.0–0.7)
Eosinophils Relative: 1 %
HEMATOCRIT: 41.2 % (ref 36.0–46.0)
Hemoglobin: 13.8 g/dL (ref 12.0–15.0)
Lymphocytes Relative: 34 %
Lymphs Abs: 2.6 10*3/uL (ref 0.7–4.0)
MCH: 29.7 pg (ref 26.0–34.0)
MCHC: 33.5 g/dL (ref 30.0–36.0)
MCV: 88.8 fL (ref 78.0–100.0)
MONO ABS: 0.5 10*3/uL (ref 0.1–1.0)
MONOS PCT: 7 %
NEUTROS ABS: 4.5 10*3/uL (ref 1.7–7.7)
Neutrophils Relative %: 57 %
Platelets: 289 10*3/uL (ref 150–400)
RBC: 4.64 MIL/uL (ref 3.87–5.11)
RDW: 12.7 % (ref 11.5–15.5)
WBC: 7.8 10*3/uL (ref 4.0–10.5)

## 2017-02-20 LAB — COMPREHENSIVE METABOLIC PANEL
ALBUMIN: 4.6 g/dL (ref 3.5–5.0)
ALT: 22 U/L (ref 14–54)
ANION GAP: 9 (ref 5–15)
AST: 24 U/L (ref 15–41)
Alkaline Phosphatase: 106 U/L (ref 38–126)
BILIRUBIN TOTAL: 0.5 mg/dL (ref 0.3–1.2)
BUN: 15 mg/dL (ref 6–20)
CO2: 27 mmol/L (ref 22–32)
Calcium: 9.3 mg/dL (ref 8.9–10.3)
Chloride: 104 mmol/L (ref 101–111)
Creatinine, Ser: 0.68 mg/dL (ref 0.44–1.00)
GFR calc non Af Amer: >60 mL/min (ref >60)
GLUCOSE: 87 mg/dL (ref 65–99)
POTASSIUM: 4.6 mmol/L (ref 3.5–5.1)
Sodium: 140 mmol/L (ref 135–145)
TOTAL PROTEIN: 8 g/dL (ref 6.5–8.1)

## 2017-02-20 LAB — APTT: aPTT: 36 seconds (ref 24–36)

## 2017-02-20 LAB — PROTIME-INR
INR: 0.98
PROTHROMBIN TIME: 12.9 s (ref 11.4–15.2)

## 2017-02-22 ENCOUNTER — Encounter (HOSPITAL_COMMUNITY): Payer: Self-pay

## 2017-02-22 ENCOUNTER — Ambulatory Visit (HOSPITAL_COMMUNITY): Payer: 59 | Admitting: Anesthesiology

## 2017-02-22 ENCOUNTER — Ambulatory Visit (HOSPITAL_COMMUNITY)
Admission: RE | Admit: 2017-02-22 | Discharge: 2017-02-22 | Disposition: A | Discharge status: Home / Self Care | Payer: 59 | Source: Ambulatory Visit | Attending: Surgery | Admitting: Surgery

## 2017-02-22 ENCOUNTER — Encounter (HOSPITAL_COMMUNITY)
Admission: RE | Disposition: A | Discharge status: Home / Self Care | Payer: Self-pay | Source: Ambulatory Visit | Attending: Surgery

## 2017-02-22 DIAGNOSIS — K8010 Calculus of gallbladder with chronic cholecystitis without obstruction: Secondary | ICD-10-CM | POA: Insufficient documentation

## 2017-02-22 DIAGNOSIS — I82409 Acute embolism and thrombosis of unspecified deep veins of unspecified lower extremity: Secondary | ICD-10-CM | POA: Diagnosis not present

## 2017-02-22 DIAGNOSIS — I10 Essential (primary) hypertension: Secondary | ICD-10-CM | POA: Insufficient documentation

## 2017-02-22 DIAGNOSIS — F17200 Nicotine dependence, unspecified, uncomplicated: Secondary | ICD-10-CM | POA: Insufficient documentation

## 2017-02-22 DIAGNOSIS — Z86718 Personal history of other venous thrombosis and embolism: Secondary | ICD-10-CM | POA: Insufficient documentation

## 2017-02-22 DIAGNOSIS — Z79899 Other long term (current) drug therapy: Secondary | ICD-10-CM | POA: Insufficient documentation

## 2017-02-22 DIAGNOSIS — K824 Cholesterolosis of gallbladder: Secondary | ICD-10-CM | POA: Diagnosis not present

## 2017-02-22 DIAGNOSIS — K8020 Calculus of gallbladder without cholecystitis without obstruction: Secondary | ICD-10-CM | POA: Diagnosis not present

## 2017-02-22 DIAGNOSIS — E7800 Pure hypercholesterolemia, unspecified: Secondary | ICD-10-CM | POA: Insufficient documentation

## 2017-02-22 HISTORY — PX: CHOLECYSTECTOMY: SHX55

## 2017-02-22 SURGERY — LAPAROSCOPIC CHOLECYSTECTOMY
Anesthesia: General

## 2017-02-22 MED ORDER — OXYCODONE HCL 5 MG PO TABS: 5.0000 mg | ORAL_TABLET | Freq: Once | ORAL | Status: DC | PRN

## 2017-02-22 MED ORDER — HEPARIN SODIUM (PORCINE) 5000 UNIT/ML IJ SOLN
INTRAMUSCULAR | Status: AC
  Filled 2017-02-22: qty 1

## 2017-02-22 MED ORDER — SCOPOLAMINE 1 MG/3DAYS TD PT72
MEDICATED_PATCH | TRANSDERMAL | Status: AC
  Filled 2017-02-22: qty 1

## 2017-02-22 MED ORDER — LIDOCAINE 2% (20 MG/ML) 5 ML SYRINGE
INTRAMUSCULAR | Status: AC
Start: 2017-02-22 — End: ?
  Filled 2017-02-22: qty 5

## 2017-02-22 MED ORDER — MIDAZOLAM HCL 2 MG/2ML IJ SOLN
INTRAMUSCULAR | Status: AC
  Filled 2017-02-22: qty 2

## 2017-02-22 MED ORDER — FENTANYL CITRATE (PF) 100 MCG/2ML IJ SOLN
INTRAMUSCULAR | Status: AC
  Filled 2017-02-22: qty 2

## 2017-02-22 MED ORDER — LABETALOL HCL 5 MG/ML IV SOLN
INTRAVENOUS | Status: AC
  Filled 2017-02-22: qty 4

## 2017-02-22 MED ORDER — PROPOFOL 10 MG/ML IV BOLUS
INTRAVENOUS | Status: AC
  Filled 2017-02-22: qty 20

## 2017-02-22 MED ORDER — LABETALOL HCL 5 MG/ML IV SOLN
INTRAVENOUS | Status: DC | PRN
Start: 2017-02-22 — End: 2017-02-22
  Administered 2017-02-22 (×2): 5 mg via INTRAVENOUS

## 2017-02-22 MED ORDER — PROMETHAZINE HCL 25 MG/ML IJ SOLN: 6.2500 mg | INTRAMUSCULAR | Status: DC | PRN

## 2017-02-22 MED ORDER — OXYCODONE HCL 5 MG/5ML PO SOLN
5.0000 mg | Freq: Once | ORAL | Status: DC | PRN
  Filled 2017-02-22: qty 5

## 2017-02-22 MED ORDER — ONDANSETRON HCL 4 MG/2ML IJ SOLN
INTRAMUSCULAR | Status: DC | PRN
  Administered 2017-02-22: 4 mg via INTRAVENOUS

## 2017-02-22 MED ORDER — ONDANSETRON HCL 4 MG/2ML IJ SOLN
INTRAMUSCULAR | Status: AC
  Filled 2017-02-22: qty 2

## 2017-02-22 MED ORDER — CELECOXIB 200 MG PO CAPS
ORAL_CAPSULE | ORAL | Status: AC
  Filled 2017-02-22: qty 1

## 2017-02-22 MED ORDER — ROCURONIUM BROMIDE 50 MG/5ML IV SOSY
PREFILLED_SYRINGE | INTRAVENOUS | Status: AC
Start: 2017-02-22 — End: ?
  Filled 2017-02-22: qty 5

## 2017-02-22 MED ORDER — FENTANYL CITRATE (PF) 100 MCG/2ML IJ SOLN
INTRAMUSCULAR | Status: DC | PRN
  Administered 2017-02-22: 100 ug via INTRAVENOUS
  Administered 2017-02-22 (×4): 50 ug via INTRAVENOUS

## 2017-02-22 MED ORDER — IBUPROFEN 600 MG PO TABS
600.0000 mg | ORAL_TABLET | Freq: Once | ORAL | Status: DC
  Filled 2017-02-22: qty 1

## 2017-02-22 MED ORDER — ACETAMINOPHEN 500 MG PO TABS
1000.0000 mg | ORAL_TABLET | ORAL | Status: AC
  Administered 2017-02-22: 1000 mg via ORAL

## 2017-02-22 MED ORDER — TRAMADOL HCL 50 MG PO TABS: 50.0000 mg | ORAL_TABLET | Freq: Four times a day (QID) | ORAL | 0 refills | Status: AC | PRN

## 2017-02-22 MED ORDER — ROCURONIUM BROMIDE 50 MG/5ML IV SOSY
PREFILLED_SYRINGE | INTRAVENOUS | Status: AC
  Filled 2017-02-22: qty 5

## 2017-02-22 MED ORDER — DEXAMETHASONE SODIUM PHOSPHATE 10 MG/ML IJ SOLN
INTRAMUSCULAR | Status: AC
  Filled 2017-02-22: qty 1

## 2017-02-22 MED ORDER — LIDOCAINE 2% (20 MG/ML) 5 ML SYRINGE
INTRAMUSCULAR | Status: DC | PRN
  Administered 2017-02-22: 100 mg via INTRAVENOUS

## 2017-02-22 MED ORDER — SCOPOLAMINE 1 MG/3DAYS TD PT72
MEDICATED_PATCH | TRANSDERMAL | Status: DC | PRN
  Administered 2017-02-22: 1 via TRANSDERMAL

## 2017-02-22 MED ORDER — CELECOXIB 200 MG PO CAPS
200.0000 mg | ORAL_CAPSULE | ORAL | Status: AC
  Administered 2017-02-22: 200 mg via ORAL

## 2017-02-22 MED ORDER — BUPIVACAINE-EPINEPHRINE 0.25% -1:200000 IJ SOLN
INTRAMUSCULAR | Status: DC | PRN
  Administered 2017-02-22: 50 mL

## 2017-02-22 MED ORDER — PROPOFOL 10 MG/ML IV BOLUS
INTRAVENOUS | Status: DC | PRN
  Administered 2017-02-22: 200 mg via INTRAVENOUS

## 2017-02-22 MED ORDER — BUPIVACAINE-EPINEPHRINE 0.25% -1:200000 IJ SOLN
INTRAMUSCULAR | Status: AC
  Filled 2017-02-22: qty 1

## 2017-02-22 MED ORDER — SUGAMMADEX SODIUM 200 MG/2ML IV SOLN
INTRAVENOUS | Status: DC | PRN
  Administered 2017-02-22: 150 mg via INTRAVENOUS

## 2017-02-22 MED ORDER — GABAPENTIN 300 MG PO CAPS
ORAL_CAPSULE | ORAL | Status: AC
  Filled 2017-02-22: qty 1

## 2017-02-22 MED ORDER — LACTATED RINGERS IR SOLN
Status: DC | PRN
  Administered 2017-02-22: 1000 mL

## 2017-02-22 MED ORDER — LACTATED RINGERS IV SOLN
INTRAVENOUS | Status: DC
  Administered 2017-02-22: 16:00:00 via INTRAVENOUS
  Administered 2017-02-22: 1000 mL via INTRAVENOUS

## 2017-02-22 MED ORDER — GABAPENTIN 300 MG PO CAPS
300.0000 mg | ORAL_CAPSULE | ORAL | Status: AC
  Administered 2017-02-22: 300 mg via ORAL

## 2017-02-22 MED ORDER — DEXAMETHASONE SODIUM PHOSPHATE 10 MG/ML IJ SOLN
INTRAMUSCULAR | Status: DC | PRN
  Administered 2017-02-22: 10 mg via INTRAVENOUS

## 2017-02-22 MED ORDER — MIDAZOLAM HCL 2 MG/2ML IJ SOLN
INTRAMUSCULAR | Status: DC | PRN
  Administered 2017-02-22: 2 mg via INTRAVENOUS

## 2017-02-22 MED ORDER — CEFAZOLIN SODIUM-DEXTROSE 2-4 GM/100ML-% IV SOLN
INTRAVENOUS | Status: AC
  Filled 2017-02-22: qty 100

## 2017-02-22 MED ORDER — ACETAMINOPHEN 500 MG PO TABS
ORAL_TABLET | ORAL | Status: AC
  Filled 2017-02-22: qty 2

## 2017-02-22 MED ORDER — SUGAMMADEX SODIUM 200 MG/2ML IV SOLN
INTRAVENOUS | Status: AC
  Filled 2017-02-22: qty 2

## 2017-02-22 MED ORDER — SUCCINYLCHOLINE CHLORIDE 200 MG/10ML IV SOSY
PREFILLED_SYRINGE | INTRAVENOUS | Status: AC
  Filled 2017-02-22: qty 10

## 2017-02-22 MED ORDER — FENTANYL CITRATE (PF) 100 MCG/2ML IJ SOLN: 25.0000 ug | INTRAMUSCULAR | Status: DC | PRN

## 2017-02-22 MED ORDER — CHLORHEXIDINE GLUCONATE CLOTH 2 % EX PADS: 6.0000 | MEDICATED_PAD | Freq: Once | CUTANEOUS | Status: DC

## 2017-02-22 MED ORDER — ROCURONIUM BROMIDE 50 MG/5ML IV SOSY
PREFILLED_SYRINGE | INTRAVENOUS | Status: DC | PRN
  Administered 2017-02-22: 50 mg via INTRAVENOUS

## 2017-02-22 MED ORDER — CEFAZOLIN SODIUM-DEXTROSE 2-4 GM/100ML-% IV SOLN
2.0000 g | INTRAVENOUS | Status: AC
  Administered 2017-02-22: 2 g via INTRAVENOUS

## 2017-02-22 MED ORDER — 0.9 % SODIUM CHLORIDE (POUR BTL) OPTIME
TOPICAL | Status: DC | PRN
  Administered 2017-02-22: 1000 mL

## 2017-02-22 MED ORDER — HEPARIN SODIUM (PORCINE) 5000 UNIT/ML IJ SOLN
5000.0000 [IU] | Freq: Once | INTRAMUSCULAR | Status: AC
  Administered 2017-02-22: 5000 [IU] via SUBCUTANEOUS

## 2017-02-22 SURGICAL SUPPLY — 40 items
ADH SKN CLS APL DERMABOND .7 (GAUZE/BANDAGES/DRESSINGS) ×1
APPLIER CLIP 5 13 M/L LIGAMAX5 (MISCELLANEOUS) ×2
APPLIER CLIP ROT 10 11.4 M/L (STAPLE)
APR CLP MED LRG 11.4X10 (STAPLE)
APR CLP MED LRG 5 ANG JAW (MISCELLANEOUS) ×1
BAG SPEC RTRVL LRG 6X4 10 (ENDOMECHANICALS) ×1
CABLE HIGH FREQUENCY MONO STRZ (ELECTRODE) ×2 IMPLANT
CHLORAPREP W/TINT 26ML (MISCELLANEOUS) ×2 IMPLANT
CLIP APPLIE 5 13 M/L LIGAMAX5 (MISCELLANEOUS) IMPLANT
CLIP APPLIE ROT 10 11.4 M/L (STAPLE) IMPLANT
COVER MAYO STAND STRL (DRAPES) IMPLANT
COVER SURGICAL LIGHT HANDLE (MISCELLANEOUS) ×2 IMPLANT
DECANTER SPIKE VIAL GLASS SM (MISCELLANEOUS) ×2 IMPLANT
DERMABOND ADVANCED (GAUZE/BANDAGES/DRESSINGS) ×1
DERMABOND ADVANCED .7 DNX12 (GAUZE/BANDAGES/DRESSINGS) ×1 IMPLANT
DISSECTOR BLUNT TIP ENDO 5MM (MISCELLANEOUS) IMPLANT
ELECT PENCIL ROCKER SW 15FT (MISCELLANEOUS) ×2 IMPLANT
ELECT REM PT RETURN 15FT ADLT (MISCELLANEOUS) ×2 IMPLANT
GLOVE BIO SURGEON STRL SZ7.5 (GLOVE) ×2 IMPLANT
GLOVE INDICATOR 8.0 STRL GRN (GLOVE) ×2 IMPLANT
GOWN STRL REUS W/TWL XL LVL3 (GOWN DISPOSABLE) ×6 IMPLANT
GRASPER SUT TROCAR 14GX15 (MISCELLANEOUS) IMPLANT
HEMOSTAT SNOW SURGICEL 2X4 (HEMOSTASIS) IMPLANT
KIT BASIN OR (CUSTOM PROCEDURE TRAY) ×2 IMPLANT
NDL INSUFFLATION 14GA 120MM (NEEDLE) IMPLANT
NEEDLE INSUFFLATION 14GA 120MM (NEEDLE) IMPLANT
POUCH SPECIMEN RETRIEVAL 10MM (ENDOMECHANICALS) ×1 IMPLANT
SCISSORS METZENBAUM CVD 33 (INSTRUMENTS) ×2 IMPLANT
SET TUBE IRRIG SUCTION NO TIP (IRRIGATION / IRRIGATOR) ×2 IMPLANT
SLEEVE ADV FIXATION 5X100MM (TROCAR) ×5 IMPLANT
SUT MNCRL AB 4-0 PS2 18 (SUTURE) ×2 IMPLANT
SYR 10ML ECCENTRIC (SYRINGE) ×2 IMPLANT
TOWEL OR 17X26 10 PK STRL BLUE (TOWEL DISPOSABLE) ×2 IMPLANT
TOWEL OR NON WOVEN STRL DISP B (DISPOSABLE) IMPLANT
TRAY LAPAROSCOPIC (CUSTOM PROCEDURE TRAY) ×2 IMPLANT
TROCAR ADV FIXATION 12X100MM (TROCAR) IMPLANT
TROCAR ADV FIXATION 5X100MM (TROCAR) ×2 IMPLANT
TROCAR XCEL BLUNT TIP 100MML (ENDOMECHANICALS) ×2 IMPLANT
TROCAR XCEL NON-BLD 11X100MML (ENDOMECHANICALS) IMPLANT
TUBING INSUF HEATED (TUBING) ×2 IMPLANT

## 2017-02-22 NOTE — Progress Notes (Signed)
Assessment unchanged.  Scripts were given per MD order.  All questions pertaining to D/C we answered.  Pt was D/C'd via ambulation and accompanied by RN. Pt able to void, tolerate fluids, and tolerate solids prior to discharge. IS teaching done with patient prior to discharge.  Sherron MondayGood, Cuma Polyakov L

## 2017-02-22 NOTE — Anesthesia Procedure Notes (Signed)
Procedure Name: Intubation Date/Time: 02/22/2017 2:43 PM Performed by: Dione Booze, CRNA Pre-anesthesia Checklist: Suction available, Patient being monitored, Emergency Drugs available and Patient identified Patient Re-evaluated:Patient Re-evaluated prior to induction Oxygen Delivery Method: Circle system utilized Preoxygenation: Pre-oxygenation with 100% oxygen Induction Type: IV induction Ventilation: Mask ventilation without difficulty Laryngoscope Size: Mac and 4 Grade View: Grade I Tube type: Oral Tube size: 7.5 mm Number of attempts: 1 Airway Equipment and Method: Stylet Placement Confirmation: ETT inserted through vocal cords under direct vision,  positive ETCO2 and breath sounds checked- equal and bilateral Secured at: 21 cm Tube secured with: Tape Dental Injury: Teeth and Oropharynx as per pre-operative assessment

## 2017-02-22 NOTE — H&P (Signed)
Interval H&P  H&P reviewed from 02/01/17 with the patient. No interval changes.  [name] is a pleasant 46yoF with hx of HTN here for laparoscopic cholecystectomy for symptomatic cholelithiasis -The anatomy and physiology of the hepatobiliary tract was discussed at length with the patient. The pathophysiology of was gallbladder disease and indications for surgery were additionally discussed at length with associated pictures and illustrations. -The planned procedure, material risks (including, but not limited to, pain, bleeding, infection, scarring, need for blood transfusion, damage to surrounding structures- blood vessels/nerves/viscus/organs, damage to bile duct, bile leak, need for additional procedures, hernia, pancreatitis, pneumonia, heart attack, stroke, death) benefits and alternatives to surgery were discussed at length. I noted a good probability that the procedure would help improve their symptoms. The patient's questions were answered to their satisfaction and they elected to proceed with surgery.

## 2017-02-22 NOTE — Progress Notes (Signed)
Pt with pain 6/10. Has taken Tramadol in the past, states she does not need anything that strong. Would like motrin if possible.  Dr. Cliffton AstersWhite paged. Order for one time dose of 600mg  motrin.   Sherron MondayGood, Treva Huyett L

## 2017-02-22 NOTE — Anesthesia Preprocedure Evaluation (Addendum)
Anesthesia Evaluation  Patient identified by MRN, date of birth, ID band Patient awake    Reviewed: Allergy & Precautions, NPO status , Patient's Chart, lab work & pertinent test results  Airway Mallampati: II  TM Distance: >3 FB Neck ROM: Full    Dental  (+) Dental Advisory Given   Pulmonary Current Smoker,    Pulmonary exam normal breath sounds clear to auscultation       Cardiovascular hypertension, + DVT  Normal cardiovascular exam Rhythm:Regular Rate:Normal  No meds for HTN   Neuro/Psych Anxiety Depression negative neurological ROS     GI/Hepatic negative GI ROS, Neg liver ROS,   Endo/Other  negative endocrine ROS  Renal/GU negative Renal ROS  negative genitourinary   Musculoskeletal negative musculoskeletal ROS (+)   Abdominal   Peds  Hematology negative hematology ROS (+)   Anesthesia Other Findings   Reproductive/Obstetrics                            Anesthesia Physical Anesthesia Plan  ASA: II  Anesthesia Plan: General   Post-op Pain Management:    Induction: Intravenous  PONV Risk Score and Plan: 4 or greater and Treatment may vary due to age or medical condition, Scopolamine patch - Pre-op, Midazolam, Dexamethasone and Ondansetron  Airway Management Planned: Oral ETT  Additional Equipment: None  Intra-op Plan:   Post-operative Plan: Extubation in OR  Informed Consent: I have reviewed the patients History and Physical, chart, labs and discussed the procedure including the risks, benefits and alternatives for the proposed anesthesia with the patient or authorized representative who has indicated his/her understanding and acceptance.   Dental advisory given  Plan Discussed with: CRNA  Anesthesia Plan Comments:         Anesthesia Quick Evaluation

## 2017-02-22 NOTE — Op Note (Signed)
02/22/2017 4:06 PM  PATIENT: [name]  47 y.o. female  Patient Care Team: Lovenia Kim, PA-C as PCP - General (Physician Assistant) Annabell Sabal NP  PRE-OPERATIVE DIAGNOSIS: SYMPTOMATIC CHOLELITHIASIS  POST-OPERATIVE DIAGNOSIS: SYMPTOMATIC CHOLELITHIASIS  PROCEDURE: Laparoscopic cholecystectomy  SURGEON: Stephanie Coup. 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 Critical view of safety obtained prior to clipping or ligating and structures.  INDICATION:  [name] is a pleasant 47 year old lady with a history of hypertension referred for evaluation of right upper quadrant abdominal pain. This had been going on for approximately 8 months. She was evaluated in 04/24/16 for this at which point she was having right upper quadrant flank pain and underwent a CT renal stone study which was remarkable for gallstones in the gallbladder. There was no renal or ureteral stones seen. Since that time her pain has persisted and she has intermittent sharp/crampy right upper quadrant pain can last a day or more. This is associated nausea and vomiting. This pain is brought on by eating fatty/greasy foods. 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 She has chills with the pain; no fevers. Labwork 04/24/16 demonstrated normal LFTs, normal WBC. CT Renal colic demonstrated gallstones  Given all of the above, she was diagnosed as having symptomatic cholelithiasis.  The anatomy and physiology of the hepatobiliary tract was discussed at length with the patient. The pathophysiology of was gallbladder disease  and indications for surgery were additionally discussed at length with associated pictures and illustrations.  The planned procedure, material risks (including, but not limited to, pain, bleeding, infection, scarring, need for blood transfusion, damage to surrounding structures- blood vessels/nerves/viscus/organs, damage to bile duct, bile leak, need for additional procedures, hernia, pancreatitis, pneumonia, heart attack, stroke, death) benefits and alternatives to surgery were discussed at length. I noted a good probability that the procedure would help improve their symptoms. The patient's questions were answered to their satisfaction and they elected to proceed with surgery.  DESCRIPTION: The patient was identified & brought into the operating room. The patient was positioned supine on the OR table. SCDs were in place and active during the entire case. The patient underwent general endotracheal anesthesia. The abdomen was prepped and draped in the standard sterile fashion and antibiotics were administered. A surgical timeout was performed and confirmed our plan.   A periumbilical incision was made. The umbilical stalk was grasped and retracted outwardly. The infraumbilical fascia was identified and incised. The peritoneal cavity was gently entered bluntly. A purse-string 0 Vicryl suture was placed. The Hasson cannula was inserted into the peritoneal cavity and insufflation with CO2 commenced to . A laparoscope was inserted into the peritoneal cavity and inspection confirmed no evidence of trocar site complications. The patient was then positioned in reverse Trendelenburg with the left side down. 3 additional 5mm trocars were placed along the right subcostal line - one 5mm port in mid subcostal region, another 5mm port in the right flank near the anterior axillary line, and a third 5mm port in the left subxiphoid region obliquely near the falciform ligament.  The liver and gallbladder were  inspected. Dense violin band adhesions consistent with Fitz-Hugh-Curtis were identified  between liver, gallbladder and abdominal wall. This additionally adhesed the gallbladder and duodenum together but there remained separated by ~1cm. These were taken down sharply. Care was taken to preserve the duodenum free of injury and none was identified at conclusion. The gallbladder fundus was grasped and elevated cephalad. An additional grasper was then placed on the infundibulum of the gallbladder and the infundibulum was retracted laterally. Gentle blunt dissection was then employed with a Art gallery [place] dissector working down into Comcast[employer]ot's triangle. The peritoneum on both sides of the gallbladder was opened with hook cautery. The cystic duct was identified, carefully circumferentially dissected. The cystic artery was then carefully circumferentially dissected. The space between the cystic artery and hepatocystic plate was developed such that the liver could be seen through a window medial to the cystic artery. The triangle of Calot was cleared of all fibrofatty tissue. At this point, a critical view of safety was achieved and the only structures visualized was the skeletonized cystic duct laterally, the skeletonized cystic artery and the liver through the window medial to the artery.   The cystic duct and artery were clipped with 2 clips on the "stay" side and 1 clip on the specimen side. The cystic duct and artery were then divided. The gallbladder was then freed from its remaining attachments to the liver using electrocautery and placed into an endocatch bag. Hemostasis was achieved and then re-verified. The rest of the abdomen was inspected no injury nor bleeding elsewhere was identified. The RUQ was irrigated with normal saline. The clips and gallbladder fossa were reinspected and noted to be in place and hemostatic.  The gallbladder and endocatch bag was then removed from the umbilical port site and passed off as  specimen. The RUQ ports were removed under direct visualization and noted to be hemostatic. The umbilical fascia was then closed using 0 Vicryl suture. The skin of all incision sites was approximated with 4-0 monocryl subcuticular suture and dermabond applied. The patient was then extubated and transferred to a stretcher for transport to PACU in satisfactory condition.

## 2017-02-22 NOTE — Discharge Instructions (Signed)
POST OP INSTRUCTIONS ° °1. DIET: As tolerated. Follow a light bland diet the first 24 hours after arrival home, such as soup, liquids, crackers, etc.  Be sure to include lots of fluids daily.  Avoid fast food or heavy meals as your are more likely to get nauseated.  Eat a low fat the next few days after surgery. ° °2. Take your usually prescribed home medications unless otherwise directed. ° °3. PAIN CONTROL: °a. Pain is best controlled by a usual combination of three different methods TOGETHER: °i. Ice/Heat °ii. Over the counter pain medication °iii. Prescription pain medication °b. Most patients will experience some swelling and bruising around the surgical site.  Ice packs or heating pads (30-60 minutes up to 6 times a day) will help. Some people prefer to use ice alone, heat alone, alternating between ice & heat.  Experiment to what works for you.  Swelling and bruising can take several weeks to resolve.   °c. It is helpful to take an over-the-counter pain medication regularly for the first few weeks: °i. Ibuprofen (Motrin/Advil) - 200mg tabs - take 3 tabs (600mg) every 6 hours as needed for pain °ii. Acetaminophen (Tylenol) - you may take 650mg every 6 hours as needed. You can take this with motrin as they act differently on the body. If you are taking a narcotic pain medication that has acetaminophen in it, do not take over the counter tylenol at the same time. ° Iii. NOTE: You may take both of these medications together - most patients  find it most helpful when alternating between the two (i.e. Ibuprofen at 6am,  tylenol at 9am, ibuprofen at 12pm ...) °d. A  prescription for pain medication should be given to you upon discharge.  Take your pain medication as prescribed if your pain is not adequatly controlled with the over-the-counter pain reliefs mentioned above. ° °4. Avoid getting constipated.  Between the surgery and the pain medications, it is common to experience some constipation.  Increasing fluid  intake and taking a fiber supplement (such as Metamucil, Citrucel, FiberCon, MiraLax, etc) 1-2 times a day regularly will usually help prevent this problem from occurring.  A mild laxative (prune juice, Milk of Magnesia, MiraLax, etc) should be taken according to package directions if there are no bowel movements after 48 hours.   ° °5. Dressing: Your incisions are covered in Dermabond which is like sterile superglue for the skin. This will come off on it's own in a couple weeks. It is waterproof and you may bathe normally starting the day after your surgery in a shower. Avoid baths/pools/lakes/oceans until your wounds have fully healed. ° °6. ACTIVITIES as tolerated:   °a. Avoid heavy lifting (>10lbs or 1 gallon of milk) for the next 6 weeks. °b. You may resume regular (light) daily activities beginning the next day--such as daily self-care, walking, climbing stairs--gradually increasing activities as tolerated.  If you can walk 30 minutes without difficulty, it is safe to try more intense activity such as jogging, treadmill, bicycling, low-impact aerobics.  °c. DO NOT PUSH THROUGH PAIN.  Let pain be your guide: If it hurts to do something, don't do it. °d. You may drive when you are no longer taking prescription pain medication, you can comfortably wear a seatbelt, and you can safely maneuver your car and apply brakes. °e. You may have sexual intercourse when it is comfortable.  ° °7. FOLLOW UP in our office °a. Please call [facility] at [phone] to set up an appointment   to see your surgeon in the office for a follow-up appointment approximately 2 weeks after your surgery. °b. Make sure that you call for this appointment the day you arrive home to insure a convenient appointment time. ° °9. If you have disability or family leave forms that need to be completed, you may have them completed by your primary care physician's office; for return to work instructions, please ask our office staff and they will be happy to  assist you in obtaining this documentation °  °When to call us [phone]: °1. Poor pain control °2. Reactions / problems with new medications (rash/itching, etc)  °3. Fever over 101.5 F (38.5 C) °4. Inability to urinate °5. Nausea/vomiting °6. Worsening swelling or bruising °7. Continued bleeding from incision. °8. Increased pain, redness, or drainage from the incision ° °The clinic staff is available to answer your questions during regular business hours (8:30am-5pm).  Please don’t hesitate to call and ask to speak to one of our nurses for clinical concerns.   A surgeon from [facility] is always on call at the hospitals °  °If you have a medical emergency, go to the nearest emergency room or call 911. ° °[facility] °[address], [address], [place], [place]  [address] °MAIN: [phone] °FAX: [phone] °[email or web address] ° ° °

## 2017-02-22 NOTE — Transfer of Care (Signed)
Immediate Anesthesia Transfer of Care Note  Patient: Christina AbrahamDeanna Lawrence  Procedure(s) Performed: LAPAROSCOPIC CHOLECYSTECTOMY (N/A )  Patient Location: PACU  Anesthesia Type:General  Level of Consciousness: awake, alert , oriented and patient cooperative  Airway & Oxygen Therapy: Patient Spontanous Breathing and Patient connected to face mask oxygen  Post-op Assessment: Report given to RN, Post -op Vital signs reviewed and stable and Patient moving all extremities X 4  Post vital signs: stable  Last Vitals:  Vitals:   02/22/17 1142 02/22/17 1615  BP: (!) 138/91 (!) 146/84  Pulse: 75 80  Resp: 16 10  Temp: 36.7 C 37.1 C  SpO2: 100% 100%    Last Pain:  Vitals:   02/22/17 1615  TempSrc:   PainSc: Asleep      Patients Stated Pain Goal: 4 (02/22/17 1202)  Complications: No apparent anesthesia complications

## 2017-02-22 NOTE — Anesthesia Postprocedure Evaluation (Signed)
Anesthesia Post Note  Patient: Christina AbrahamDeanna Lawrence  Procedure(s) Performed: LAPAROSCOPIC CHOLECYSTECTOMY (N/A )     Patient location during evaluation: PACU Anesthesia Type: General Level of consciousness: awake and alert Pain management: pain level controlled Vital Signs Assessment: post-procedure vital signs reviewed and stable Respiratory status: spontaneous breathing, nonlabored ventilation and respiratory function stable Cardiovascular status: blood pressure returned to baseline and stable Postop Assessment: no apparent nausea or vomiting Anesthetic complications: no    Last Vitals:  Vitals:   02/22/17 1639 02/22/17 1645  BP:  136/89  Pulse: 77 70  Resp: 18 10  Temp:  36.7 C  SpO2: 100% 99%    Last Pain:  Vitals:   02/22/17 1645  TempSrc:   PainSc: Asleep                 Beryle Lathehomas E Anthonia Monger

## 2017-02-23 ENCOUNTER — Encounter (HOSPITAL_COMMUNITY): Payer: Self-pay | Admitting: Surgery

## 2017-07-01 DIAGNOSIS — L237 Allergic contact dermatitis due to plants, except food: Secondary | ICD-10-CM | POA: Diagnosis not present

## 2017-12-11 DIAGNOSIS — Z01419 Encounter for gynecological examination (general) (routine) without abnormal findings: Secondary | ICD-10-CM | POA: Diagnosis not present

## 2017-12-11 DIAGNOSIS — Z6827 Body mass index (BMI) 27.0-27.9, adult: Secondary | ICD-10-CM | POA: Diagnosis not present

## 2021-02-23 ENCOUNTER — Other Ambulatory Visit (HOSPITAL_BASED_OUTPATIENT_CLINIC_OR_DEPARTMENT_OTHER): Payer: Self-pay | Admitting: Nurse Practitioner

## 2021-02-23 DIAGNOSIS — R7989 Other specified abnormal findings of blood chemistry: Secondary | ICD-10-CM

## 2021-03-16 ENCOUNTER — Ambulatory Visit (HOSPITAL_BASED_OUTPATIENT_CLINIC_OR_DEPARTMENT_OTHER)
Admission: RE | Admit: 2021-03-16 | Discharge: 2021-03-16 | Disposition: A | Discharge status: Home / Self Care | Payer: 59 | Source: Ambulatory Visit | Attending: Nurse Practitioner | Admitting: Nurse Practitioner

## 2021-03-16 ENCOUNTER — Other Ambulatory Visit: Payer: Self-pay

## 2021-03-16 DIAGNOSIS — R7989 Other specified abnormal findings of blood chemistry: Secondary | ICD-10-CM | POA: Insufficient documentation
